# Patient Record
Sex: Male | Born: 1965 | Race: White | Hispanic: No | Marital: Single | Smoking: Current every day smoker
Health system: Southern US, Community
[De-identification: ages and names within clinical notes are randomized; demographics above are authoritative.]

## PROBLEM LIST (undated history)

## (undated) DIAGNOSIS — E119 Type 2 diabetes mellitus without complications: Secondary | ICD-10-CM

## (undated) DIAGNOSIS — N401 Enlarged prostate with lower urinary tract symptoms: Secondary | ICD-10-CM

## (undated) DIAGNOSIS — N4830 Priapism, unspecified: Secondary | ICD-10-CM

## (undated) DIAGNOSIS — N529 Male erectile dysfunction, unspecified: Secondary | ICD-10-CM

## (undated) DIAGNOSIS — Z794 Long term (current) use of insulin: Secondary | ICD-10-CM

## (undated) DIAGNOSIS — N138 Other obstructive and reflux uropathy: Secondary | ICD-10-CM

---

## 2007-06-23 ENCOUNTER — Other Ambulatory Visit: Payer: Self-pay

## 2007-06-23 ENCOUNTER — Inpatient Hospital Stay: Payer: Self-pay | Admitting: Internal Medicine

## 2007-06-24 ENCOUNTER — Other Ambulatory Visit: Payer: Self-pay

## 2015-07-26 ENCOUNTER — Emergency Department: Payer: Self-pay

## 2015-07-26 ENCOUNTER — Emergency Department
Admission: EM | Admit: 2015-07-26 | Discharge: 2015-07-27 | Disposition: A | Discharge status: Home / Self Care | Payer: Self-pay | Attending: Emergency Medicine | Admitting: Emergency Medicine

## 2015-07-26 DIAGNOSIS — T84498A Other mechanical complication of other internal orthopedic devices, implants and grafts, initial encounter: Secondary | ICD-10-CM

## 2015-07-26 DIAGNOSIS — T849XXA Unspecified complication of internal orthopedic prosthetic device, implant and graft, initial encounter: Secondary | ICD-10-CM | POA: Insufficient documentation

## 2015-07-26 DIAGNOSIS — S99921A Unspecified injury of right foot, initial encounter: Secondary | ICD-10-CM | POA: Insufficient documentation

## 2015-07-26 DIAGNOSIS — W1789XA Other fall from one level to another, initial encounter: Secondary | ICD-10-CM | POA: Insufficient documentation

## 2015-07-26 DIAGNOSIS — S99922A Unspecified injury of left foot, initial encounter: Secondary | ICD-10-CM | POA: Insufficient documentation

## 2015-07-26 MED ORDER — HYDROCODONE-ACETAMINOPHEN 5-325 MG PO TABS
1.0000 | ORAL_TABLET | Freq: Four times a day (QID) | ORAL | Status: DC | PRN
Start: 2015-07-26 — End: 2015-08-01

## 2015-07-26 MED ORDER — HYDROCODONE-ACETAMINOPHEN 5-325 MG PO TABS
2.0000 | ORAL_TABLET | Freq: Once | ORAL | Status: AC
Start: 2015-07-26 — End: 2015-07-26
  Administered 2015-07-26: 2 via ORAL

## 2015-07-26 MED ORDER — HYDROCODONE-ACETAMINOPHEN 5-325 MG PO TABS
ORAL_TABLET | Freq: Once | ORAL | Status: AC
Start: 2015-07-27 — End: 2015-07-27

## 2015-07-26 MED ORDER — HYDROCODONE-ACETAMINOPHEN 5-325 MG PO TABS
ORAL_TABLET | ORAL | Status: AC
Start: 2015-07-26 — End: ?
  Filled 2015-07-26: qty 2

## 2015-07-27 MED ORDER — HYDROCODONE-ACETAMINOPHEN 5-325 MG PO TABS
ORAL_TABLET | ORAL | Status: AC
Start: 2015-07-27 — End: ?
  Filled 2015-07-27: qty 2

## 2015-07-27 NOTE — Discharge Instructions (Signed)
Your xray showed a broken calcaneal screw in your right foot

## 2015-07-27 NOTE — ED Provider Notes (Signed)
Physician/Midlevel provider first contact with patient: 07/26/15 2305         [facility] EMERGENCY DEPARTMENT History and Physical Exam      Patient Name: [name], [name]  Encounter Date:  07/26/2015  Attending Physician: Justice Britain, MD  PCP: Christa See, MD  Patient DOB:  [birth year]  MRN:  [id number]  Room:  E13/EDA13-A      History of Presenting Illness     Chief complaint: Foot Pain    HPI/ROS is limited by: none  HPI/ROS given by: patient    Location: heels b/l feet  Duration: since this evening  Severity: severe    Aidric Bowman is a 49 y.o. male who presents with b/l foot pain.  He states he jumped down 2.5 feet off of his truck and has had severe pain, sharp, worse with attempted weight baring.  He notes prior injury from fall from height requiring hardware placement in b/l feet.  R foot hurts more then left.  He denies other pain/injury      Review of Systems     Review of Systems   Endo/Heme/Allergies: Does not bruise/bleed easily.        Allergies     Pt has No Known Allergies.    Medications     Current Outpatient Rx   Name  Route  Sig  Dispense  Refill   . HYDROcodone-acetaminophen (NORCO) 5-325 MG per tablet    Oral    Take 1-2 tablets by mouth every 6 (six) hours as needed.    20 tablet    0     . naproxen sodium (ANAPROX) 220 MG tablet    Oral    Take 220 mg by mouth 2 (two) times daily as needed.                  Past Medical History     Pt has no past medical history on file.    Past Surgical History     Pt has past surgical history that includes Knee surgery (Right) and Foot surgery (Bilateral).    Family History     The family history is not on file.    Social History     Pt reports that he has been smoking.  He does not have any smokeless tobacco history on file. He reports that he does not drink alcohol or use illicit drugs.    Physical Exam     Blood pressure 140/83, pulse 94, temperature 97.5 F (36.4 C), temperature source Oral, resp. rate 19, height 1.854 m, weight 122.471 kg, SpO2 96  %.    Constitutional: Vital signs reviewed. Well appearing.  Head: Normocephalic, atraumatic  Eyes: Conjunctiva and sclera are normal.  No injection or discharge.  Ears, Nose, Throat:  Normal external examination of the nose and ears.    Neck: Normal range of motion. Trachea midline.  Respiratory/Chest:  No respiratory distress.   Upper Extremity:  No edema. No cyanosis.  Lower Extremity:  No edema. No cyanosis.  Surgical scars.  TTP plantar aspect b/l heels.  No ankle or dorsal TTP  Skin: Warm and dry. No rash.  Psychiatric:  Normal affect.  Normal insight  Neuro: strength and sensation in b/l LE intact.     Orders Placed     Orders Placed This Encounter   Procedures   . XR Foot Left AP Lateral And Oblique   . XR Foot Right AP Lateral And Oblique       Diagnostic Results  The results of the diagnostic studies below have been reviewed by myself:    Labs  Results     ** No results found for the last 24 hours. **          Radiologic Studies  Radiology Results (24 Hour)     Procedure Component Value Units Date/Time    XR Foot Left AP Lateral And Oblique [[id number]] Collected:  07/26/15 2343    Order Status:  Completed Updated:  07/26/15 2345    Narrative:      Clinical History:  Fall/jumping injury with foot pain.    Examination:  AP, lateral and oblique views of the left foot.    Comparison:  None available.      Impression:        Calcaneal fixation by plate and screw hardware.    Diffuse osteopenia. No definite acute fracture identified. No dislocation.    Plantar calcaneal enthesophyte noted.  ReadingStation:[email or web address]    XR Foot Right AP Lateral And Oblique [[id number]] Collected:  07/26/15 2340    Order Status:  Completed Updated:  07/26/15 2343    Narrative:      Clinical History:  Foot pain after fall/jumping    Examination:  AP, lateral and oblique views of the right foot.    Comparison:  None.      Impression:        Sequela of calcaneal fixation by plate and screw hardware.     There is a fractured  calcaneal screw best seen on AP and oblique views which is age-indeterminate.    Diffuse osteopenia. No definite acute fractures identified. No dislocation.      ReadingStation:[email or web address]          EKG: none      MDM / Critical Care     Blood pressure 140/83, pulse 94, temperature 97.5 F (36.4 C), temperature source Oral, resp. rate 19, height 1.854 m, weight 122.471 kg, SpO2 96 %.    DDX includes fracture, plantar fasciitis, hardware dislodgement among others.    ED Course     Xray notable for broken screw.  Pt states he will f/u with his surgeon upon arriving home after his trip.  Analgesia provided.    Procedures         Diagnosis / Disposition     Clinical Impression  1. Internal fixation device (pin, rod, or screw) mechanical complication, initial encounter    2. Foot injury, right, initial encounter    3. Foot injury, left, initial encounter        Disposition  ED Disposition     Discharge Thoren Dukes discharge to home/self care.    Condition at disposition: Stable            Prescriptions  Discharge Medication List as of 07/26/2015 11:58 PM      START taking these medications    Details   HYDROcodone-acetaminophen (NORCO) 5-325 MG per tablet Take 1-2 tablets by mouth every 6 (six) hours as needed., Starting 07/26/2015, Until Discontinued, Print                         Justice Britain, MD  07/27/15 [phone]

## 2015-07-31 ENCOUNTER — Emergency Department: Payer: Self-pay

## 2015-07-31 ENCOUNTER — Emergency Department
Admission: EM | Admit: 2015-07-31 | Discharge: 2015-08-01 | Disposition: A | Discharge status: Home / Self Care | Payer: Self-pay | Attending: Sports Medicine" | Admitting: Sports Medicine"

## 2015-07-31 DIAGNOSIS — M79671 Pain in right foot: Secondary | ICD-10-CM | POA: Insufficient documentation

## 2015-07-31 NOTE — ED Provider Notes (Signed)
[facility] EMERGENCY DEPARTMENT History and Physical Exam      Patient Name: [name], [name]  Encounter Date:  07/31/2015  Attending Physician: Gareth Morgan, MD  PCP: Christa See, MD  Patient DOB:  09-Mar-1966  MRN:  [id number]  Room:  E14/EDA14-A      History of Presenting Illness     Chief complaint: Foot Pain    HPI/ROS is limited by: none  HPI/ROS given by: patient    CONTEXT: Sahand Corriher is a 49 y.o. male who presents with Complaints of right heel pain. The patient was seen here 6 days ago for the same complaint. He has a history of bilateral calcanei of fractures in the past treated with Plates and screws. He states that last week he jumped and landed on his feet. He was seen in this Emergency Department at that time with bilateral (right > left) heel pain. He had x-rays of both heels. The right heel showed a fractured screw but no bony fractures of either foot noted. At this time he is not having any pain in his left heel.  He states that he contacted with his orthopedist back in [place] where he is from open  (He is working in this area until next Saturday)  He states that his orthopedist com stated he might need an antibiotic and case he is developing an infection. He has had no drainage from the foot.  LOCATION: Pain is over the posterior aspect of his right heel.  SEVERITY: The symptoms are described as Moderate to severe. He ranks the pain a 9 on a scale of 0-10.   DURATION: Pain has been going on for about a week ever since he jumped and landed on his heels.   QUALITY: Pain as sharp throbbing and constant.   ASSOCIATED SIGNS/ SYMPTOMS: He has had no fever chills nausea or vomiting.  EXACERBATING/ MITIGATING FACTORS: Pain is worse with walking or anything that to just the plantar aspect of his right foot.     Review of Systems     Review of Systems   Constitutional: Negative for fever and chills.   Gastrointestinal: Negative for nausea and vomiting.   Skin: Negative for rash.   Neurological: Negative  for tingling.         Allergies     Pt has No Known Allergies.    Medications     Current Outpatient Rx   Name  Route  Sig  Dispense  Refill   . DISCONTD: HYDROcodone-acetaminophen (NORCO) 5-325 MG per tablet    Oral    Take 1-2 tablets by mouth every 6 (six) hours as needed.    20 tablet    0     . DISCONTD: naproxen sodium (ANAPROX) 220 MG tablet    Oral    Take 220 mg by mouth 2 (two) times daily as needed.             . cephALEXin (KEFLEX) 500 MG capsule    Oral    Take 1 capsule (500 mg total) by mouth 3 (three) times daily.    30 capsule    0     . HYDROcodone-acetaminophen (NORCO) 5-325 MG per tablet    Oral    Take 1-2 tablets by mouth every 6 (six) hours as needed. No driving or working while taking.  Avoid alcohol while taking    20 tablet    0     . ibuprofen (ADVIL,MOTRIN) 800 MG tablet    Oral  Take 1 tablet (800 mg total) by mouth every 8 (eight) hours as needed.    20 tablet    0          Past Medical History     Pt has no past medical history on file.    Past Surgical History     Pt has past surgical history that includes Knee surgery (Right) and Foot surgery (Bilateral).    Family History     The family history is not on file.    Social History     Pt reports that he has been smoking.  He does not have any smokeless tobacco history on file. He reports that he does not drink alcohol or use illicit drugs.    Physical Exam     Blood pressure 160/88, pulse 100, temperature 97.8 F (36.6 C), temperature source Oral, resp. rate 22, height 1.854 m, weight 122.471 kg, SpO2 95 %.    GENERAL: The patient is well-developed, well-nourished,   male whom appears in moderate discomfort and in no distress.   Patient is alert, and oriented to person place and circumstance. There is no evidence of respiratory distress. The patient is able to ambulate with weight bearing both feet.    SKIN:  Warm, dry, mucous membranes moist, normal turgor, no rash noted.  EXTREMITIES:  Right heel: There is some tenderness over the  heel area. He has a thick callus to the plantar aspect of his foot. No open sores or redness or drainage noted. Old surgical scars noted to be well healed. Left ear appears grossly the same as the right. There is no tenderness however to the left heel. Extremities otherwise revealed:  No gross visible deformity , free range of motion.  No edema or cyanosis.      Orders Placed     Orders Placed This Encounter   Procedures   . CBC and differential       Diagnostic Results       The results of the diagnostic studies below have been reviewed by myself:    Labs  Results     Procedure Component Value Units Date/Time    CBC and differential [[id number]]  (Abnormal) Collected:  08/01/15 0010    Specimen Information:  Blood from Blood Updated:  08/01/15 0031     WBC 12.8 (H) K/cmm      RBC 4.78 M/cmm      Hemoglobin 14.7 gm/dL      Hematocrit 56.2 %      MCV 91 fL      MCH 31 pg      MCHC 34 gm/dL      RDW 13.0 %      PLT CT 269 K/cmm      MPV 7.4 fL      NEUTROPHIL % 64.7 %      Lymphocytes 24.1 %      Monocytes 7.3 %      Eosinophils % 2.7 %      Basophils % 1.2 %      Neutrophils Absolute 8.3 K/cmm      Lymphocytes Absolute 3.1 K/cmm      Monocytes Absolute 0.9 K/cmm      Eosinophils Absolute 0.3 K/cmm      BASO Absolute 0.2 K/cmm           MDM / Critical Care     Differential diagnosis considered includes: Contusion right heel, but Occult visible fracture right heel, Osteomyelitis considered felt low likelihood,  Procedures     Old records reviewed. The patient was seen here for Bilateral foot pain 5 days ago. He was prescribed hydrocodone/acetaminophen at that time.     Course in ED     I discussed with the patient that we would monitor his orthopedist request in [place] to start him on antibiotics but felt the likelihood of osteomyelitis to be low. We will also arrange a followup with local orthopedist this week For further evaluation.     Diagnosis / Disposition     Clinical Impression  1. Heel pain, right         Disposition  ED Disposition     None          Dion Body, MD  [address]  [place] [address]  [phone]    In 3 days  Return to the Emergency Department if symptoms worsen or if you have any questions whatsoever. Feel free to call at anytime with questions about your diagnosis and/or discharge instructions etc.       Prescriptions  New Prescriptions    CEPHALEXIN (KEFLEX) 500 MG CAPSULE    Take 1 capsule (500 mg total) by mouth 3 (three) times daily.    HYDROCODONE-ACETAMINOPHEN (NORCO) 5-325 MG PER TABLET    Take 1-2 tablets by mouth every 6 (six) hours as needed. No driving or working while taking.  Avoid alcohol while taking    IBUPROFEN (ADVIL,MOTRIN) 800 MG TABLET    Take 1 tablet (800 mg total) by mouth every 8 (eight) hours as needed.       Note:  This chart was generated by the Epic EMR system/ speech recognition and may contain inherent errors, including typographical, or omissions not intended by the user      Gareth Morgan, MD  08/01/15 [phone]

## 2015-08-01 LAB — CBC AND DIFFERENTIAL
Basophils %: 1.2 % (ref 0.0–3.0)
Basophils Absolute: 0.2 10*3/uL (ref 0.0–0.3)
Eosinophils %: 2.7 % (ref 0.0–7.0)
Eosinophils Absolute: 0.3 10*3/uL (ref 0.0–0.8)
Hematocrit: 43.5 % (ref 39.0–52.5)
Hemoglobin: 14.7 gm/dL (ref 13.0–17.5)
Lymphocytes Absolute: 3.1 10*3/uL (ref 0.6–5.1)
Lymphocytes: 24.1 % (ref 15.0–46.0)
MCH: 31 pg (ref 28–35)
MCHC: 34 gm/dL (ref 31–36)
MCV: 91 fL (ref 80–100)
MPV: 7.4 fL (ref 6.0–10.0)
Monocytes Absolute: 0.9 10*3/uL (ref 0.1–1.7)
Monocytes: 7.3 % (ref 3.0–15.0)
Neutrophils %: 64.7 % (ref 42.0–78.0)
Neutrophils Absolute: 8.3 10*3/uL (ref 1.7–8.6)
PLT CT: 269 10*3/uL (ref 130–440)
RBC: 4.78 10*6/uL (ref 4.00–5.70)
RDW: 12.4 % (ref 10.5–14.5)
WBC: 12.8 10*3/uL — ABNORMAL HIGH (ref 4.00–11.00)

## 2015-08-01 MED ORDER — IBUPROFEN 600 MG PO TABS
ORAL_TABLET | ORAL | Status: AC
Start: 2015-08-01 — End: ?
  Filled 2015-08-01: qty 1

## 2015-08-01 MED ORDER — IBUPROFEN 800 MG PO TABS
800.0000 mg | ORAL_TABLET | Freq: Three times a day (TID) | ORAL | Status: AC | PRN
Start: 2015-08-01 — End: ?

## 2015-08-01 MED ORDER — IBUPROFEN 600 MG PO TABS
600.0000 mg | ORAL_TABLET | Freq: Four times a day (QID) | ORAL | Status: DC | PRN
Start: 2015-08-01 — End: 2015-08-01
  Administered 2015-08-01: 600 mg via ORAL

## 2015-08-01 MED ORDER — HYDROCODONE-ACETAMINOPHEN 5-325 MG PO TABS
ORAL_TABLET | ORAL | Status: AC
Start: 2015-08-01 — End: ?
  Filled 2015-08-01: qty 2

## 2015-08-01 MED ORDER — CEPHALEXIN 250 MG PO CAPS
500.0000 mg | ORAL_CAPSULE | Freq: Once | ORAL | Status: AC
Start: 2015-08-01 — End: 2015-08-01
  Administered 2015-08-01: 500 mg via ORAL

## 2015-08-01 MED ORDER — HYDROCODONE-ACETAMINOPHEN 5-325 MG PO TABS
2.0000 | ORAL_TABLET | Freq: Four times a day (QID) | ORAL | Status: DC | PRN
Start: 2015-08-01 — End: 2015-08-01
  Administered 2015-08-01: 2 via ORAL

## 2015-08-01 MED ORDER — CEPHALEXIN 500 MG PO CAPS
500.0000 mg | ORAL_CAPSULE | Freq: Three times a day (TID) | ORAL | Status: AC
Start: 2015-08-01 — End: 2015-08-11

## 2015-08-01 MED ORDER — CEPHALEXIN 250 MG PO CAPS
ORAL_CAPSULE | ORAL | Status: AC
Start: 2015-08-01 — End: ?
  Filled 2015-08-01: qty 2

## 2015-08-01 MED ORDER — HYDROCODONE-ACETAMINOPHEN 5-325 MG PO TABS
1.0000 | ORAL_TABLET | Freq: Four times a day (QID) | ORAL | Status: DC | PRN
Start: 2015-08-01 — End: 2018-03-20

## 2015-08-01 NOTE — ED Notes (Signed)
[facility] Radiology and spoke with Duwayne Heck requesting a copy of patients Xrays from previous visit be mailed to pt house. She is unsure of when it will be able to be shipped out but will make a disc for pt to be mailed.

## 2015-08-01 NOTE — Discharge Instructions (Signed)
Understanding the Pain Response  Your pain is important. It can slow healing and keep you from being active. You may have acute or chronic pain. Both types of pain respond to treatment. Work with your health care professional. Together you can find relief.  Types of pain  Acute pain is caused by a health problem or injury. The pain usually goes away when its cause is treated. You may have pain:   From an illness or injury that needs emergency care.   After an operation, such as heart surgery.   During and after the birth of your baby.  Chronic pain lasts 3 to 6months or more. It can be caused by a health problem or injury, such as arthritis or a shoulder strain. Chronic pain can also exist without a clear cause.  Your perception of pain  Pain is a complex phenomenon that involves many of the chemicals found naturally in the spinal cord and brain. All pain signals travel to the brain. The brain sends back signals to protect the body. The brain also makesits own painkillers (endorphins). These can help reduce the pain.   1. Pain starts in one or more parts of the body. In some cases, the site of the pain is far from its source.  2. Pain signals move through nerves and up the spinal cord.  3. The brain reads the signals as pain. Natural painkillers are released.  4. The feeling of pain can bereduced in this way.     2000-2015 [employer] [address], [place] [address]. All rights reserved. This information is not intended as a substitute for professional medical care. Always follow your healthcare professional's instructions.

## 2015-08-01 NOTE — ED Notes (Signed)
SEE QUICK TRIAGE

## 2015-08-01 NOTE — ED Notes (Signed)
Pt discharged, prescriptions X3 in his hand at discharge. Side effects and narcotic teaching given, included not driving or working machinery while taking narcotic. Ambulatory and co worker is driving him home.

## 2015-08-08 ENCOUNTER — Emergency Department (HOSPITAL_COMMUNITY)
Admission: EM | Admit: 2015-08-08 | Discharge: 2015-08-08 | Disposition: A | Discharge status: Home / Self Care | Payer: Self-pay | Attending: Emergency Medicine | Admitting: Emergency Medicine

## 2015-08-08 ENCOUNTER — Encounter (HOSPITAL_COMMUNITY): Payer: Self-pay | Admitting: *Deleted

## 2015-08-08 ENCOUNTER — Emergency Department (HOSPITAL_COMMUNITY): Payer: Self-pay

## 2015-08-08 DIAGNOSIS — R062 Wheezing: Secondary | ICD-10-CM | POA: Insufficient documentation

## 2015-08-08 DIAGNOSIS — R079 Chest pain, unspecified: Secondary | ICD-10-CM | POA: Insufficient documentation

## 2015-08-08 DIAGNOSIS — Z720 Tobacco use: Secondary | ICD-10-CM | POA: Insufficient documentation

## 2015-08-08 DIAGNOSIS — R05 Cough: Secondary | ICD-10-CM | POA: Insufficient documentation

## 2015-08-08 DIAGNOSIS — R0602 Shortness of breath: Secondary | ICD-10-CM | POA: Insufficient documentation

## 2015-08-08 LAB — CBC
HCT: 44.7 % (ref 39.0–52.0)
HEMOGLOBIN: 15.1 g/dL (ref 13.0–17.0)
MCH: 30.3 pg (ref 26.0–34.0)
MCHC: 33.8 g/dL (ref 30.0–36.0)
MCV: 89.6 fL (ref 78.0–100.0)
Platelets: 275 10*3/uL (ref 150–400)
RBC: 4.99 MIL/uL (ref 4.22–5.81)
RDW: 13.3 % (ref 11.5–15.5)
WBC: 12.4 10*3/uL — ABNORMAL HIGH (ref 4.0–10.5)

## 2015-08-08 LAB — URINALYSIS, ROUTINE W REFLEX MICROSCOPIC
BILIRUBIN URINE: NEGATIVE
Glucose, UA: NEGATIVE mg/dL
Hgb urine dipstick: NEGATIVE
Ketones, ur: NEGATIVE mg/dL
LEUKOCYTES UA: NEGATIVE
NITRITE: NEGATIVE
Protein, ur: NEGATIVE mg/dL
SPECIFIC GRAVITY, URINE: 1.031 — AB (ref 1.005–1.030)
UROBILINOGEN UA: 1 mg/dL (ref 0.0–1.0)
pH: 5.5 (ref 5.0–8.0)

## 2015-08-08 LAB — BASIC METABOLIC PANEL
ANION GAP: 9 (ref 5–15)
BUN: 21 mg/dL — ABNORMAL HIGH (ref 6–20)
CHLORIDE: 104 mmol/L (ref 101–111)
CO2: 26 mmol/L (ref 22–32)
CREATININE: 1.11 mg/dL (ref 0.61–1.24)
Calcium: 9.4 mg/dL (ref 8.9–10.3)
GFR calc non Af Amer: >60 mL/min (ref >60)
Glucose, Bld: 96 mg/dL (ref 65–99)
POTASSIUM: 3.9 mmol/L (ref 3.5–5.1)
Sodium: 139 mmol/L (ref 135–145)

## 2015-08-08 LAB — LIPASE, BLOOD: LIPASE: 17 U/L — AB (ref 22–51)

## 2015-08-08 LAB — I-STAT TROPONIN, ED: TROPONIN I, POC: 0 ng/mL (ref 0.00–0.08)

## 2015-08-08 LAB — TROPONIN I

## 2015-08-08 MED ORDER — ALBUTEROL SULFATE HFA 108 (90 BASE) MCG/ACT IN AERS
2.0000 | INHALATION_SPRAY | Freq: Once | RESPIRATORY_TRACT | Status: AC
  Administered 2015-08-08: 2 via RESPIRATORY_TRACT
  Filled 2015-08-08: qty 6.7

## 2015-08-08 MED ORDER — IOHEXOL 350 MG/ML SOLN
100.0000 mL | Freq: Once | INTRAVENOUS | Status: AC | PRN
  Administered 2015-08-08: 73 mL via INTRAVENOUS

## 2015-08-08 MED ORDER — MORPHINE SULFATE (PF) 4 MG/ML IV SOLN
8.0000 mg | Freq: Once | INTRAVENOUS | Status: AC
  Administered 2015-08-08: 8 mg via INTRAVENOUS
  Filled 2015-08-08: qty 2

## 2015-08-08 MED ORDER — ONDANSETRON HCL 4 MG/2ML IJ SOLN
4.0000 mg | Freq: Once | INTRAMUSCULAR | Status: AC
  Administered 2015-08-08: 4 mg via INTRAVENOUS
  Filled 2015-08-08: qty 2

## 2015-08-08 MED ORDER — RANITIDINE HCL 150 MG/10ML PO SYRP
150.0000 mg | ORAL_SOLUTION | Freq: Once | ORAL | Status: AC
  Administered 2015-08-08: 150 mg via ORAL
  Filled 2015-08-08: qty 10

## 2015-08-08 MED ORDER — GI COCKTAIL ~~LOC~~
30.0000 mL | Freq: Once | ORAL | Status: AC
  Administered 2015-08-08: 30 mL via ORAL
  Filled 2015-08-08: qty 30

## 2015-08-08 NOTE — ED Notes (Signed)
Patient transported to CT 

## 2015-08-08 NOTE — Progress Notes (Signed)
Patient listed as not having insurance or a pcp.  EDCM spoke to patient at bedside.  Patient reports he has traveled from [place] to [facility] on business.  Patient confirms he has a pcp in [place].  EDCM strongly encouraged patient to follow up with his pcp in [place].  No further EDCM needs at this time.

## 2015-08-08 NOTE — ED Provider Notes (Signed)
CSN: [id number]     Arrival date & time 08/08/15  1439 History   First MD Initiated Contact with Patient 08/08/15 1701     Chief Complaint  Patient presents with  . Chest Pain     (Consider location/radiation/quality/duration/timing/severity/associated sxs/prior Treatment) Patient is a 49 y.o. male presenting with chest pain.  Chest Pain Pain location:  L chest Pain quality: aching and sharp   Pain radiates to:  Does not radiate Pain radiates to the back: no   Pain severity:  Mild Onset quality:  Gradual Duration:  3 days Timing:  Constant Progression:  Worsening Chronicity:  New Context: breathing and movement   Associated symptoms: cough and shortness of breath   Associated symptoms: no back pain, no fever, no nausea and not vomiting     History reviewed. No pertinent past medical history. History reviewed. No pertinent past surgical history. History reviewed. No pertinent family history. Social History  Substance Use Topics  . Smoking status: Current Every Day Smoker -- 1.00 packs/day for 20 years    Types: Cigarettes  . Smokeless tobacco: None  . Alcohol Use: No    Review of Systems  Constitutional: Negative for fever and chills.  HENT: Negative for congestion, drooling and ear pain.   Respiratory: Positive for cough and shortness of breath.   Cardiovascular: Positive for chest pain.  Gastrointestinal: Negative for nausea and vomiting.  Endocrine: Negative for polydipsia and polyuria.  Musculoskeletal: Negative for myalgias and back pain.  Skin: Negative for pallor and wound.  All other systems reviewed and are negative.     Allergies  Review of patient's allergies indicates no known allergies.  Home Medications   Prior to Admission medications   Medication Sig Start Date End Date Taking? Authorizing Provider  naproxen sodium (ALEVE) 220 MG tablet Take 440-660 mg by mouth every 12 (twelve) hours as needed (pain).   Yes Historical Provider, MD   BP  128/73 mmHg  Pulse 75  Temp(Src) 98.3 F (36.8 C) (Oral)  Resp 18  SpO2 94% Physical Exam  Constitutional: He is oriented to person, place, and time. He appears well-developed and well-nourished.  HENT:  Head: Normocephalic and atraumatic.  Eyes: Conjunctivae and EOM are normal.  Neck: Normal range of motion. Neck supple.  Cardiovascular: Normal rate and regular rhythm.   Pulmonary/Chest: Effort normal. No respiratory distress. He has wheezes. He exhibits no tenderness.  Abdominal: Soft. Bowel sounds are normal. He exhibits no distension. There is no tenderness.  Musculoskeletal: Normal range of motion. He exhibits no edema or tenderness.  Neurological: He is alert and oriented to person, place, and time.  Skin: Skin is warm and dry.  Nursing note and vitals reviewed.   ED Course  Procedures (including critical care time) Labs Review Labs Reviewed  BASIC METABOLIC PANEL - Abnormal; Notable for the following:    BUN 21 (*)    All other components within normal limits  CBC - Abnormal; Notable for the following:    WBC 12.4 (*)    All other components within normal limits  URINALYSIS, ROUTINE W REFLEX MICROSCOPIC (NOT AT [facility]) - Abnormal; Notable for the following:    Specific Gravity, Urine 1.031 (*)    All other components within normal limits  LIPASE, BLOOD - Abnormal; Notable for the following:    Lipase 17 (*)    All other components within normal limits  TROPONIN I  Rosezena Sensor, ED    Imaging Review Dg Chest 2 View  08/08/2015  CLINICAL DATA:  Shortness of breath and left chest pain for 3 days.  EXAM: CHEST  2 VIEW  COMPARISON:  None.  FINDINGS: The cardiomediastinal silhouette is unremarkable.  Mild peribronchial thickening noted.  There is no evidence of focal airspace disease, pulmonary edema, suspicious pulmonary nodule/mass, pleural effusion, or pneumothorax. No acute bony abnormalities are identified.  IMPRESSION: No evidence of acute cardiopulmonary disease.   Question COPD.   Electronically Signed   By: Harmon Pier M.D.   On: 08/08/2015 15:50   Ct Angio Chest Pe W/cm &/or Wo Cm  08/08/2015   CLINICAL DATA:  49 year old male with chest pain for several days.  EXAM: CT ANGIOGRAPHY CHEST WITH CONTRAST  TECHNIQUE: Multidetector CT imaging of the chest was performed using the standard protocol during bolus administration of intravenous contrast. Multiplanar CT image reconstructions and MIPs were obtained to evaluate the vascular anatomy.  CONTRAST:  73mL OMNIPAQUE IOHEXOL 350 MG/ML SOLN  COMPARISON:  Chest radiograph dated 08/08/2015  FINDINGS: Minimal bibasilar subpleural dependent atelectatic changes. The lungs are clear. There is no pleural effusion or pneumothorax. The central airways are patent.  The visualized thoracic aorta is unremarkable. The no CT evidence of pulmonary embolism. Top-normal right hilar lymph nodes. There is no mediastinal adenopathy. There is coronary vascular calcification. No cardiomegaly or pericardial effusion. Dx the thyroid gland and esophagus appear unremarkable.  There is no axillary adenopathy. The chest wall soft tissues appear unremarkable. The osseous structures are intact. There is diffuse fatty infiltration of the liver. The visualized upper abdomen is otherwise unremarkable.  Review of the MIP images confirms the above findings.  IMPRESSION: No CT evidence of pulmonary embolism.   Electronically Signed   By: Elgie Collard M.D.   On: 08/08/2015 19:35   I have personally reviewed and evaluated these images and lab results as part of my medical decision-making.   EKG Interpretation   Date/Time:  Monday August 08 2015 14:49:00 EDT Ventricular Rate:  102 PR Interval:  132 QRS Duration: 90 QT Interval:  342 QTC Calculation: 445 R Axis:   53 Text Interpretation:  Sinus tachycardia Abnormal R-wave progression, early  transition Since last tracing rate faster Confirmed by WENTZ  MD, ELLIOTT  ([id number]) on 08/08/2015 3:46:11  PM      MDM   Final diagnoses:  SOB (shortness of breath)   49 year old male here with sharp substernal chest pain. Had it days ago but this morning it worsened. Associated symptoms. Initial concerns for pneumonia versus PE. Patient is traveling person so CT scan was done which was negative for both. Improved prior to discharge. EKG was negative. Delta troponin was negative making ACS unlikely, he will continue to follow-up with his doctor for a stress test at home.  I have personally and contemperaneously reviewed labs and imaging and used in my decision making as above.   A medical screening exam was performed and I feel the patient has had an appropriate workup for their chief complaint at this time and likelihood of emergent condition existing is low. They have been counseled on decision, discharge, follow up and which symptoms necessitate immediate return to the emergency department. They or their family verbally stated understanding and agreement with plan and discharged in stable condition.      Marily Memos, MD 08/08/15 [phone]

## 2015-08-08 NOTE — ED Notes (Signed)
Pt concerned that results were inconclusive. Pt is a&ox4 and ambulatory. Questions r/t dc were denied

## 2015-08-08 NOTE — ED Notes (Addendum)
Pt reports chest pain for several days, chest pain increased today. Pain 10/10. Repots SOB, pain increases with deep breath. Intermittent abd pain throughout the day. Denies cardiac hx.

## 2020-05-25 ENCOUNTER — Inpatient Hospital Stay
Admit: 2020-05-25 | Discharge: 2020-05-25 | Disposition: A | Discharge status: Home / Self Care | Attending: Emergency Medicine

## 2020-05-25 DIAGNOSIS — S92411A Displaced fracture of proximal phalanx of right great toe, initial encounter for closed fracture: Secondary | ICD-10-CM

## 2020-05-25 MED ORDER — LIDOCAINE HCL (PF) 1 % IJ SOLN
1 % | Freq: Once | INTRAMUSCULAR | Status: DC
Start: 2020-05-25 — End: 2020-05-25

## 2020-05-25 MED ORDER — OXYCODONE-ACETAMINOPHEN 5-325 MG PO TABS
5-325 MG | Freq: Once | ORAL | Status: AC
Start: 2020-05-25 — End: 2020-05-25
  Administered 2020-05-25: 15:00:00 1 via ORAL

## 2020-05-25 MED ORDER — OXYCODONE-ACETAMINOPHEN 5-325 MG PO TABS
5-325 MG | ORAL_TABLET | Freq: Four times a day (QID) | ORAL | 0 refills | Status: DC | PRN
Start: 2020-05-25 — End: 2020-05-28

## 2020-05-25 MED FILL — OXYCODONE-ACETAMINOPHEN 5-325 MG PO TABS: 5-325 mg | ORAL | Qty: 1

## 2020-05-28 ENCOUNTER — Inpatient Hospital Stay
Admit: 2020-05-28 | Discharge: 2020-05-28 | Disposition: A | Discharge status: Home / Self Care | Attending: Emergency Medicine

## 2020-05-28 DIAGNOSIS — S92411D Displaced fracture of proximal phalanx of right great toe, subsequent encounter for fracture with routine healing: Secondary | ICD-10-CM

## 2020-05-28 MED ORDER — HYDROMORPHONE HCL 1 MG/ML IJ SOLN
1 MG/ML | INTRAMUSCULAR | Status: AC
Start: 2020-05-28 — End: 2020-05-28
  Administered 2020-05-28: 15:00:00 1 via INTRAMUSCULAR

## 2020-05-28 MED ORDER — CEPHALEXIN 250 MG PO CAPS
250 MG | ORAL_CAPSULE | Freq: Four times a day (QID) | ORAL | 0 refills | Status: AC
Start: 2020-05-28 — End: 2020-06-04

## 2020-05-28 MED ORDER — BACITRACIN 500 UNIT/GM EX OINT
500 UNIT/GM | Freq: Once | CUTANEOUS | Status: AC
Start: 2020-05-28 — End: 2020-05-28
  Administered 2020-05-28: 15:00:00 via TOPICAL

## 2020-05-28 MED ORDER — OXYCODONE-ACETAMINOPHEN 5-325 MG PO TABS
5-325 MG | ORAL_TABLET | Freq: Four times a day (QID) | ORAL | 0 refills | Status: AC | PRN
Start: 2020-05-28 — End: 2020-05-31

## 2020-05-28 MED ORDER — HYDROMORPHONE HCL 1 MG/ML IJ SOLN
1 MG/ML | Freq: Once | INTRAMUSCULAR | Status: AC
Start: 2020-05-28 — End: 2020-05-28

## 2020-05-28 MED FILL — DILAUDID 1 MG/ML IJ SOLN: 1 mg/mL | INTRAMUSCULAR | Qty: 1

## 2020-05-28 MED FILL — BACITRACIN 500 UNIT/GM EX OINT: 500 UNIT/GM | CUTANEOUS | Qty: 14

## 2022-05-09 NOTE — Progress Notes (Signed)
Pre Procedure Patient Instructions    Procedure Location hospital: [facility]: [address] [place] - Drop off at Outpatient Services to the left of the main hospital entrance and proceed to the gold elevator on your left (past the security desk) to the 2nd floor Surgery Reception desk.   Procedure Date 05/17/2022  Arrival Time  0600      Medications:  Medication to be taken the morning of surgery with a few sips of water only: tamsulosin, finesteride  Stop all supplements, vitamins and herbal remedies one week prior to your procedure, unless your doctor told you to continue taking.  Do not take over the counter pain medications except plain Tylenol or Acetaminophen unless your doctor told you to do so.  If you are taking blood thinners, call the doctor performing your procedure and prescribing physician for instructions on when to stop.    Procedure Preparation    Diet Restrictions:No food or drink including gum or mints -after midnight    Skin Preparation:   Wash with Hibiclens or an antibacterial soap (e.g. Dial soap) the night before and morning of procedure.  Do not put on any deodorants, lotions, powders, or oils afterwards.  Be sure to put on clean clothes    Other Preparation:  Call your surgeon right away if you get any wounds, cuts, scrapes, scabs, rashes, bug bites at or near your operative site or if you have any fever, cold or flu symptoms.        Day of Procedure Patient Instruction:  Do not smoke, vape, chew tobacco, drink alcohol or use recreational drugs on the day of your procedure  Remove all jewelry, piercings, and metal accessories  Do not wear contacts, tampons, make-up, lotions, creams, powders, fragrances or deodorant  Do not bring valuables or money  Bring a copy of your Living Will and/or Medical Durable Power of Attorney if you have one  Bring a list of current medications including name and dosage  Bring a picture ID and insurance card and any of the  following that are applicable to you:     Inhalers      CPAP or BiPAP machine     Remote for spinal cord stimulator or other implanted device     Insulin pump supplies     Walker or other orthopedic device necessary for postop     Storage case for eyeglasses, hearing aids, dentures, etc     A loose button-up shirt if instructed          .  If you are going home the same day as your procedure, a support person should accompany you to the facility and must transport you home.  If you plan to take public transportation of any sort, your support person must accompany you home.  You will need someone to stay with you for 24 hours after your procedure with sedation of any kind.             The information and visitor policy was reviewed with you during your Pre-Admission Testing interview and you verbalized understanding. If you have any additional questions please contact [phone]    For financial questions regarding your procedure at a [facility] facility, please contact [phone], option 1    For financial questions regarding anesthesia at a [facility] facility, please  contact [phone]

## 2022-05-16 NOTE — H&P (Signed)
Urology Attending Admission Note      Reason for Admission: increasing obstructive symptoms from enlarging prostate    History: the patient has developed increasing urgency and some urge incontinence cystoscopy shows a large obstructing proximal state with bladder trabeculation he is admitted for transurethral resection of the prostate    Meds: see med rec  Family History, Social History, Review of Systems:  Reviewed and agreed to as per chart    Exam:    Vitals:  Ht 6\' 1"  (1.854 m)   Wt 259 lb (117.5 kg)   BMI 34.17 kg/m   No data recorded  No intake or output data in the 24 hours ending 05/16/22 1648    Physical:   Well developed, well nourished in no acute distress  Mood indicates no abnormalities. Pt doesn't appear depressed  Orientated to time and place  Neck is supple, trachea is midline  Respiratory effort is normal  Cardiovascular show no extremity swelling  Abdomen no masses or hernias are palpated, there is no tenderness. Liver and Spleen appear normal.  Skin show no abnormal lesions  Lymph nodes are not palpated in the inguinal, neck, or axillary area.     Male GU:  Penis appears normal and circumcised  Urethral meatus is normal in size and location  Scrotum appears normal and both testicles appear normal in size and location  Sphincter has good tone  Anus is inspected. There are no perineal masses  Prostate is enlarged without nodule, no tenderness and no induration  Seminal Vesicles are not palpable      Labs:  WBC:  No results found for: WBC  Hemoglobin/Hematocrit:  No results found for: HGB, HCT  BMP:  No results found for: NA, K, CL, CO2, BUN, LABALBU, CREATININE, CALCIUM, GFRAA, LABGLOM  PT/INR:  No results found for: PROTIME, INR  PTT:  No results found for: APTT[APTT    Urinalysis: negative    Imaging:  none    Impression/Plan: BPH with a large prostate and increasing obstructive and urgency symptoms cystoscopy confirms patients admitted for TURP    [facility], MD

## 2022-05-17 ENCOUNTER — Inpatient Hospital Stay
Admission: EM | Admit: 2022-05-17 | Discharge: 2022-05-19 | Disposition: A | Discharge status: Home / Self Care | Payer: BLUE CROSS/BLUE SHIELD | Admitting: Urology

## 2022-05-17 DIAGNOSIS — N401 Enlarged prostate with lower urinary tract symptoms: Secondary | ICD-10-CM

## 2022-05-17 DIAGNOSIS — N138 Other obstructive and reflux uropathy: Secondary | ICD-10-CM

## 2022-05-17 LAB — CBC
Hematocrit: 49.1 % (ref 38.0–52.0)
Hemoglobin: 16.8 g/dL (ref 13.0–17.3)
MCH: 29.8 pg (ref 27.0–34.5)
MCHC: 34.2 g/dL (ref 32.0–36.0)
MCV: 87.2 fL (ref 84.0–100.0)
MPV: 9.9 fL (ref 7.2–13.2)
NRBC Absolute: 0 10*3/uL (ref 0.000–0.012)
NRBC Automated: 0 % (ref 0.0–0.2)
Platelets: 309 10*3/uL (ref 140–440)
RBC: 5.63 x10e6/mcL — ABNORMAL HIGH (ref 4.00–5.60)
RDW: 13.5 % (ref 11.0–16.0)
WBC: 12.3 10*3/uL — ABNORMAL HIGH (ref 3.8–10.6)

## 2022-05-17 LAB — POCT GLUCOSE
POC Glucose: 95 mg/dL (ref 65.0–110.0)
POC Glucose: 92 mg/dL (ref 65.0–110.0)
POC Glucose: 144 mg/dL — ABNORMAL HIGH (ref 65.0–110.0)

## 2022-05-17 MED ORDER — SODIUM CHLORIDE 0.9 % IV SOLN
0.9 % | INTRAVENOUS | Status: AC | PRN
Start: 2022-05-17 — End: 2022-05-19

## 2022-05-17 MED ORDER — HYDROCODONE-ACETAMINOPHEN 5-325 MG PO TABS
5-325 MG | ORAL | Status: AC
Start: 2022-05-17 — End: 2022-05-17
  Administered 2022-05-17: 16:00:00 1 via ORAL

## 2022-05-17 MED ORDER — GLUCAGON HCL (DIAGNOSTIC) 1 MG IJ SOLR
1 MG | INTRAMUSCULAR | Status: AC | PRN
Start: 2022-05-17 — End: 2022-05-19

## 2022-05-17 MED ORDER — NORMAL SALINE FLUSH 0.9 % IV SOLN
0.9 % | INTRAVENOUS | Status: DC | PRN
Start: 2022-05-17 — End: 2022-05-17

## 2022-05-17 MED ORDER — MORPHINE SULFATE 2 MG/ML IJ SOLN
2 MG/ML | INTRAMUSCULAR | Status: AC | PRN
Start: 2022-05-17 — End: 2022-05-19
  Administered 2022-05-17 – 2022-05-19 (×7): 2 mg via INTRAVENOUS

## 2022-05-17 MED ORDER — NORMAL SALINE FLUSH 0.9 % IV SOLN
0.9 % | Freq: Two times a day (BID) | INTRAVENOUS | Status: AC
Start: 2022-05-17 — End: 2022-05-19
  Administered 2022-05-17 – 2022-05-19 (×3): 10 mL via INTRAVENOUS

## 2022-05-17 MED ORDER — HYDROMORPHONE HCL 2 MG/ML IJ SOLN
2 MG/ML | INTRAMUSCULAR | Status: AC
Start: 2022-05-17 — End: ?

## 2022-05-17 MED ORDER — NICOTINE 14 MG/24HR TD PT24
1424 MG/24HR | Freq: Every day | TRANSDERMAL | Status: DC
Start: 2022-05-17 — End: 2022-05-19
  Administered 2022-05-17 – 2022-05-19 (×3): 1 via TRANSDERMAL

## 2022-05-17 MED ORDER — HYDROMORPHONE HCL 1 MG/ML IJ SOLN
1 MG/ML | INTRAMUSCULAR | Status: DC | PRN
Start: 2022-05-17 — End: 2022-05-17

## 2022-05-17 MED ORDER — INSULIN LISPRO 100 UNIT/ML IJ SOLN
100 UNIT/ML | Freq: Three times a day (TID) | INTRAMUSCULAR | Status: AC
Start: 2022-05-17 — End: 2022-05-19

## 2022-05-17 MED ORDER — NORMAL SALINE FLUSH 0.9 % IV SOLN
0.9 % | Freq: Two times a day (BID) | INTRAVENOUS | Status: DC
Start: 2022-05-17 — End: 2022-05-17

## 2022-05-17 MED ORDER — ONDANSETRON 4 MG PO TBDP
4 MG | Freq: Three times a day (TID) | ORAL | Status: AC | PRN
Start: 2022-05-17 — End: 2022-05-19

## 2022-05-17 MED ORDER — PROPOFOL 200 MG/20ML IV EMUL
200 MG/20ML | INTRAVENOUS | Status: AC
Start: 2022-05-17 — End: ?

## 2022-05-17 MED ORDER — HYDROMORPHONE HCL 2 MG PO TABS
2 MG | ORAL | Status: AC | PRN
Start: 2022-05-17 — End: 2022-05-19
  Administered 2022-05-18 – 2022-05-19 (×6): 2 mg via ORAL

## 2022-05-17 MED ORDER — LIDOCAINE HCL (PF) 2 % IJ SOLN
2 % | INTRAMUSCULAR | Status: DC | PRN
Start: 2022-05-17 — End: 2022-05-17
  Administered 2022-05-17: 12:00:00 100 via INTRAVENOUS

## 2022-05-17 MED ORDER — MIDAZOLAM HCL 2 MG/2ML IJ SOLN
2 MG/ML | INTRAMUSCULAR | Status: AC
Start: 2022-05-17 — End: ?

## 2022-05-17 MED ORDER — DEXTROSE 10 % IV BOLUS
INTRAVENOUS | Status: AC | PRN
Start: 2022-05-17 — End: 2022-05-19

## 2022-05-17 MED ORDER — IBUPROFEN 200 MG PO TABS
200 MG | Freq: Three times a day (TID) | ORAL | Status: AC | PRN
Start: 2022-05-17 — End: 2022-05-19

## 2022-05-17 MED ORDER — FENTANYL CITRATE (PF) 100 MCG/2ML IJ SOLN
100 MCG/2ML | INTRAMUSCULAR | Status: DC | PRN
Start: 2022-05-17 — End: 2022-05-17
  Administered 2022-05-17 (×2): 50 via INTRAVENOUS

## 2022-05-17 MED ORDER — DEXMEDETOMIDINE HCL 200 MCG/2ML IV SOLN
200 MCG/2ML | INTRAVENOUS | Status: AC
Start: 2022-05-17 — End: ?

## 2022-05-17 MED ORDER — ONDANSETRON HCL 4 MG/2ML IJ SOLN
4 MG/2ML | INTRAMUSCULAR | Status: AC
Start: 2022-05-17 — End: ?

## 2022-05-17 MED ORDER — MIDAZOLAM HCL 2 MG/2ML IJ SOLN
2 MG/ML | Freq: Once | INTRAMUSCULAR | Status: DC | PRN
Start: 2022-05-17 — End: 2022-05-17

## 2022-05-17 MED ORDER — HYDROMORPHONE HCL 2 MG PO TABS
2 | ORAL | Status: DC | PRN
Start: 2022-05-17 — End: 2022-05-19

## 2022-05-17 MED ORDER — LABETALOL HCL 5 MG/ML IV SOLN
5 MG/ML | INTRAVENOUS | Status: DC | PRN
Start: 2022-05-17 — End: 2022-05-17

## 2022-05-17 MED ORDER — DIPHENHYDRAMINE HCL 50 MG/ML IJ SOLN
50 MG/ML | Freq: Once | INTRAMUSCULAR | Status: DC | PRN
Start: 2022-05-17 — End: 2022-05-17

## 2022-05-17 MED ORDER — SODIUM CHLORIDE 0.9 % IV SOLN
0.9 % | INTRAVENOUS | Status: DC | PRN
Start: 2022-05-17 — End: 2022-05-17

## 2022-05-17 MED ORDER — IPRATROPIUM-ALBUTEROL 0.5-2.5 (3) MG/3ML IN SOLN
Freq: Once | RESPIRATORY_TRACT | Status: DC | PRN
Start: 2022-05-17 — End: 2022-05-17

## 2022-05-17 MED ORDER — PROPOFOL 200 MG/20ML IV EMUL
200 MG/20ML | INTRAVENOUS | Status: DC | PRN
Start: 2022-05-17 — End: 2022-05-17
  Administered 2022-05-17: 12:00:00 200 via INTRAVENOUS

## 2022-05-17 MED ORDER — TAMSULOSIN HCL 0.4 MG PO CAPS
0.4 MG | Freq: Every day | ORAL | Status: AC
Start: 2022-05-17 — End: 2022-05-19
  Administered 2022-05-18 – 2022-05-19 (×2): 0.4 mg via ORAL

## 2022-05-17 MED ORDER — LACTATED RINGERS IV SOLN
INTRAVENOUS | Status: AC
Start: 2022-05-17 — End: 2022-05-19

## 2022-05-17 MED ORDER — HYDROMORPHONE HCL 2 MG/ML IJ SOLN
2 MG/ML | INTRAMUSCULAR | Status: DC | PRN
Start: 2022-05-17 — End: 2022-05-17
  Administered 2022-05-17 (×2): .5 via INTRAVENOUS

## 2022-05-17 MED ORDER — LACTATED RINGERS IV SOLN
INTRAVENOUS | Status: DC
Start: 2022-05-17 — End: 2022-05-17
  Administered 2022-05-17 (×3): via INTRAVENOUS

## 2022-05-17 MED ORDER — FENTANYL CITRATE (PF) 100 MCG/2ML IJ SOLN
100 MCG/2ML | INTRAMUSCULAR | Status: AC
Start: 2022-05-17 — End: ?

## 2022-05-17 MED ORDER — SODIUM CHLORIDE 0.45 % IV SOLN
0.45 % | INTRAVENOUS | Status: AC
Start: 2022-05-17 — End: 2022-05-19
  Administered 2022-05-17 – 2022-05-18 (×2): via INTRAVENOUS

## 2022-05-17 MED ORDER — DEXTROSE 10 % IV SOLN
10 % | INTRAVENOUS | Status: AC | PRN
Start: 2022-05-17 — End: 2022-05-19

## 2022-05-17 MED ORDER — ONDANSETRON HCL 4 MG/2ML IJ SOLN
4 MG/2ML | INTRAMUSCULAR | Status: DC | PRN
Start: 2022-05-17 — End: 2022-05-17
  Administered 2022-05-17: 12:00:00 4 via INTRAVENOUS

## 2022-05-17 MED ORDER — CEFAZOLIN 3000 MG IN NS 100 ML IVPB
Status: AC
Start: 2022-05-17 — End: 2022-05-17
  Administered 2022-05-17: 12:00:00 3000 mg via INTRAVENOUS

## 2022-05-17 MED ORDER — SUCCINYLCHOLINE CHLORIDE 20 MG/ML IJ SOLN
20 MG/ML | INTRAMUSCULAR | Status: DC | PRN
Start: 2022-05-17 — End: 2022-05-17
  Administered 2022-05-17: 12:00:00 180 via INTRAVENOUS

## 2022-05-17 MED ORDER — MIDAZOLAM HCL 2 MG/2ML IJ SOLN
2 MG/ML | INTRAMUSCULAR | Status: DC | PRN
Start: 2022-05-17 — End: 2022-05-17
  Administered 2022-05-17: 12:00:00 2 via INTRAVENOUS

## 2022-05-17 MED ORDER — GLUCOSE 4 G PO CHEW
4 g | ORAL | Status: AC | PRN
Start: 2022-05-17 — End: 2022-05-19

## 2022-05-17 MED ORDER — SUCCINYLCHOLINE CHLORIDE 20 MG/ML IJ SOLN
20 MG/ML | INTRAMUSCULAR | Status: AC
Start: 2022-05-17 — End: ?

## 2022-05-17 MED ORDER — NORMAL SALINE FLUSH 0.9 % IV SOLN
0.9 % | INTRAVENOUS | Status: AC | PRN
Start: 2022-05-17 — End: 2022-05-19

## 2022-05-17 MED ORDER — DEXAMETHASONE SODIUM PHOSPHATE 4 MG/ML IJ SOLN
4 MG/ML | INTRAMUSCULAR | Status: AC
Start: 2022-05-17 — End: ?

## 2022-05-17 MED ORDER — LIDOCAINE HCL (PF) 2 % IJ SOLN
2 % | INTRAMUSCULAR | Status: AC
Start: 2022-05-17 — End: ?

## 2022-05-17 MED ORDER — LIDOCAINE HCL (PF) 1 % IJ SOLN
1 % | Freq: Once | INTRAMUSCULAR | Status: AC | PRN
Start: 2022-05-17 — End: 2022-05-17
  Administered 2022-05-17: 11:00:00 0.2 mL via INTRADERMAL

## 2022-05-17 MED ORDER — ONDANSETRON HCL 4 MG/2ML IJ SOLN
4 MG/2ML | Freq: Four times a day (QID) | INTRAMUSCULAR | Status: AC | PRN
Start: 2022-05-17 — End: 2022-05-19

## 2022-05-17 MED ORDER — INSULIN LISPRO 100 UNIT/ML IJ SOLN
100 UNIT/ML | Freq: Every evening | INTRAMUSCULAR | Status: AC
Start: 2022-05-17 — End: 2022-05-19

## 2022-05-17 MED ORDER — PRAVASTATIN SODIUM 20 MG PO TABS
20 MG | Freq: Every evening | ORAL | Status: AC
Start: 2022-05-17 — End: 2022-05-19
  Administered 2022-05-18 – 2022-05-19 (×2): 10 mg via ORAL

## 2022-05-17 MED ORDER — DEXAMETHASONE SODIUM PHOSPHATE 4 MG/ML IJ SOLN
4 MG/ML | INTRAMUSCULAR | Status: DC | PRN
Start: 2022-05-17 — End: 2022-05-17
  Administered 2022-05-17: 12:00:00 8 via INTRAVENOUS

## 2022-05-17 MED ORDER — ACETAMINOPHEN 325 MG PO TABS
325 MG | ORAL | Status: AC | PRN
Start: 2022-05-17 — End: 2022-05-19

## 2022-05-17 MED ORDER — HYDROCODONE-ACETAMINOPHEN 5-325 MG PO TABS
5-325 MG | Freq: Once | ORAL | Status: AC
Start: 2022-05-17 — End: 2022-05-17

## 2022-05-17 MED ORDER — HALOPERIDOL LACTATE 5 MG/ML IJ SOLN
5 MG/ML | Freq: Once | INTRAMUSCULAR | Status: DC | PRN
Start: 2022-05-17 — End: 2022-05-17

## 2022-05-17 MED ORDER — FINASTERIDE 5 MG PO TABS
5 MG | Freq: Every day | ORAL | Status: AC
Start: 2022-05-17 — End: 2022-05-19
  Administered 2022-05-18 – 2022-05-19 (×2): 5 mg via ORAL

## 2022-05-17 MED ORDER — DEXMEDETOMIDINE HCL 200 MCG/2ML IV SOLN
200 MCG/2ML | INTRAVENOUS | Status: DC | PRN
Start: 2022-05-17 — End: 2022-05-17
  Administered 2022-05-17: 12:00:00 10 via INTRAVENOUS
  Administered 2022-05-17: 13:00:00 20 via INTRAVENOUS
  Administered 2022-05-17: 12:00:00 10 via INTRAVENOUS

## 2022-05-17 MED FILL — XYLOCAINE-MPF 1 % IJ SOLN: 1 % | INTRAMUSCULAR | Qty: 2

## 2022-05-17 MED FILL — DEXMEDETOMIDINE HCL 200 MCG/2ML IV SOLN: 200 MCG/2ML | INTRAVENOUS | Qty: 2

## 2022-05-17 MED FILL — HYDROMORPHONE HCL 2 MG/ML IJ SOLN: 2 MG/ML | INTRAMUSCULAR | Qty: 1

## 2022-05-17 MED FILL — MORPHINE SULFATE 2 MG/ML IJ SOLN: 2 mg/mL | INTRAMUSCULAR | Qty: 1

## 2022-05-17 MED FILL — FENTANYL CITRATE (PF) 100 MCG/2ML IJ SOLN: 100 MCG/2ML | INTRAMUSCULAR | Qty: 2

## 2022-05-17 MED FILL — MIDAZOLAM HCL 2 MG/2ML IJ SOLN: 2 MG/ML | INTRAMUSCULAR | Qty: 2

## 2022-05-17 MED FILL — HYDROCODONE-ACETAMINOPHEN 5-325 MG PO TABS: 5-325 MG | ORAL | Qty: 1

## 2022-05-17 MED FILL — DIPRIVAN 200 MG/20ML IV EMUL: 200 MG/20ML | INTRAVENOUS | Qty: 20

## 2022-05-17 MED FILL — DEXAMETHASONE SODIUM PHOSPHATE 4 MG/ML IJ SOLN: 4 MG/ML | INTRAMUSCULAR | Qty: 2

## 2022-05-17 MED FILL — XYLOCAINE-MPF 2 % IJ SOLN: 2 % | INTRAMUSCULAR | Qty: 5

## 2022-05-17 MED FILL — SUCCINYLCHOLINE CHLORIDE 20 MG/ML IJ SOLN: 20 MG/ML | INTRAMUSCULAR | Qty: 10

## 2022-05-17 MED FILL — NICOTINE 14 MG/24HR TD PT24: 14 MG/24HR | TRANSDERMAL | Qty: 1

## 2022-05-17 MED FILL — ONDANSETRON HCL 4 MG/2ML IJ SOLN: 4 MG/2ML | INTRAMUSCULAR | Qty: 2

## 2022-05-17 MED FILL — CEFAZOLIN 3000 MG IN NS 100 ML IVPB: Qty: 100

## 2022-05-17 NOTE — Anesthesia Pre-Procedure Evaluation (Signed)
Department of Anesthesiology  Preprocedure Note       Name:  Garrett Manning   Age:  56 y.o.  DOB:  07/08/1966                                          MRN:  [id number]         Date:  05/17/2022      Surgeon: Moishe Spice):  Cl[facility], MD    Procedure: Procedure(s):  CYSTOSCOPY TRANSURETHRAL RESECTION PROSTATE    Medications prior to admission:   Prior to Admission medications    Medication Sig Start Date End Date Taking? Authorizing Provider   metFORMIN (GLUCOPHAGE-XR) 750 MG extended release tablet Take 1 tablet by mouth daily (with breakfast)   Yes Historical Provider, MD   Semaglutide, 1 MG/DOSE, (OZEMPIC, 1 MG/DOSE,) 2 MG/1.5ML SOPN Inject into the skin   Yes Historical Provider, MD   tamsulosin (FLOMAX) 0.4 MG capsule Take 1 capsule by mouth in the morning and at bedtime   Yes Historical Provider, MD   ibuprofen (ADVIL;MOTRIN) 600 MG tablet Take 1 tablet by mouth every 6 hours as needed for Pain   Yes Historical Provider, MD   baclofen (LIORESAL) 10 MG tablet Take 1 tablet by mouth 3 times daily   Yes Historical Provider, MD   finasteride (PROSCAR) 5 MG tablet Take 1 tablet by mouth daily   Yes Historical Provider, MD   celecoxib (CELEBREX) 200 MG capsule Take 1 capsule by mouth daily    Historical Provider, MD       Current medications:    Current Facility-Administered Medications   Medication Dose Route Frequency Provider Last Rate Last Admin   . sodium chloride flush 0.9 % injection 5-40 mL  5-40 mL IntraVENous 2 times per day [facility], MD       . sodium chloride flush 0.9 % injection 5-40 mL  5-40 mL IntraVENous PRN [facility], MD       . 0.9 % sodium chloride infusion   IntraVENous PRN [facility], MD       . ceFAZolin (ANCEF) 3000 mg in sodium chloride 0.9% 100 mL IVPB  3,000 mg IntraVENous On Call to OR [facility], MD       . sodium chloride flush 0.9 % injection 5-40 mL  5-40 mL IntraVENous 2 times per day Clarisse Gouge, MD        . lactated ringers IV soln infusion   IntraVENous Continuous Clarisse [facility], MD 30 mL/hr at 05/17/22 0648 New Bag at 05/17/22 1660       Allergies:  No Known Allergies    Problem List:    Patient Active Problem List   Diagnosis Code   . BPH with urinary obstruction N40.1, N13.8       Past Medical History:        Diagnosis Date   . Back pain    . Diabetes ([facility])    . Enlarged prostate        Past Surgical History:        Procedure Laterality Date   . FOOT SURGERY Bilateral    . KNEE ARTHROSCOPY Right    . TONSILLECTOMY         Social History:    Social History     Tobacco Use   . Smoking status: Every Day  Packs/day: 1.00     Types: Cigarettes   . Smokeless tobacco: Not on file   Substance Use Topics   . Alcohol use: Never                                Ready to quit: Not Answered  Counseling given: Not Answered      Vital Signs (Current):   Vitals:    05/09/22 1409 05/17/22 0622   BP:  137/88   Pulse:  91   Resp:  20   Temp:  97.6 F (36.4 C)   TempSrc:  Oral   SpO2:  96%   Weight: 259 lb (117.5 kg) 253 lb 4.9 oz (114.9 kg)   Height: 6\' 1"  (1.854 m) 6\' 1"  (1.854 m)                                              BP Readings from Last 3 Encounters:   05/17/22 137/88   05/28/20 138/88   05/25/20 135/80       NPO Status: Time of last liquid consumption: 2359                        Time of last solid consumption: 1800                        Date of last liquid consumption: 05/16/22                        Date of last solid food consumption: 05/16/22    BMI:   Wt Readings from Last 3 Encounters:   05/17/22 253 lb 4.9 oz (114.9 kg)   05/28/20 290 lb (131.5 kg)     Body mass index is 33.42 kg/m.    CBC: No results found for: WBC, RBC, HGB, HCT, MCV, RDW, PLT    CMP: No results found for: NA, K, CL, CO2, BUN, CREATININE, GFRAA, AGRATIO, LABGLOM, GLUCOSE, GLU, PROT, CALCIUM, BILITOT, ALKPHOS, AST, ALT    POC Tests:   Recent Labs     05/17/22  0650   POCGLU 95.0       Coags: No results found for: PROTIME, INR,  APTT    HCG (If Applicable): No results found for: PREGTESTUR, PREGSERUM, HCG, HCGQUANT     ABGs: No results found for: PHART, PO2ART, PCO2ART, HCO3ART, BEART, O2SATART     Type & Screen (If Applicable):  No results found for: LABABO, LABRH    Drug/Infectious Status (If Applicable):  No results found for: HIV, HEPCAB    COVID-19 Screening (If Applicable): No results found for: COVID19        Anesthesia Evaluation    Airway: Mallampati: II          Dental:    (+) upper dentures      Pulmonary:Negative Pulmonary ROS and normal exam                               Cardiovascular:Negative CV ROS                      Neuro/Psych:   Negative Neuro/Psych ROS  GI/Hepatic/Renal:   (+) renal disease: kidney stones,           Endo/Other:    (+) Diabetes, .                 Abdominal: normal exam            Vascular: negative vascular ROS.         Other Findings:           Anesthesia Plan      general     ASA 3       Induction: intravenous.      Anesthetic plan and risks discussed with patient.      Plan discussed with CRNA.    Attending anesthesiologist reviewed and agrees with Preprocedure content                Delena Bali, MD   05/17/2022

## 2022-05-17 NOTE — Other (Signed)
Denies SI

## 2022-05-17 NOTE — Anesthesia Post-Procedure Evaluation (Signed)
Department of Anesthesiology  Postprocedure Note    Patient: Garrett Manning  MRN: [id number]  Birthdate: [birth year]  Date of evaluation: 05/17/2022      Procedure Summary     Date: 05/17/22 Room / Location: [facility] OR [facility] / [facility] MAIN OR    Anesthesia Start: 0759 Anesthesia Stop: 0918    Procedure: CYSTOSCOPY TRANSURETHRAL RESECTION PROSTATE Diagnosis:       Benign prostatic hyperplasia with urinary retention      (Benign prostatic hyperplasia with urinary retention [N40.1, R33.8])    Surgeons: Clarisse Gouge, MD Responsible Provider: Delena Bali, MD    Anesthesia Type: general ASA Status: 3          Anesthesia Type: No value filed.    Aldrete Phase I: Aldrete Score: 8    Aldrete Phase II: Aldrete Score: 8      Anesthesia Post Evaluation    Patient location during evaluation: PACU  Patient participation: complete - patient participated  Level of consciousness: awake and alert  Pain score: 1  Airway patency: patent  Complications: no  Cardiovascular status: blood pressure returned to baseline  Respiratory status: acceptable  Hydration status: euvolemic

## 2022-05-17 NOTE — Op Note (Signed)
Operative Note      Patient: Garrett Manning  Date of Birth: [birth year]  MRN: [id number]    Date of Procedure: 06/02/22    Pre-Op Diagnosis Codes:     * Benign prostatic hyperplasia with urinary retention [N40.1, R33.8]    Post-Op Diagnosis: Same       Procedure(s):  CYSTOSCOPY TRANSURETHRAL RESECTION PROSTATE    Surgeon(s):  Cl[facility]isse [facility], MD    Assistant:   * No surgical staff found *    Anesthesia: General    Estimated Blood Loss (mL): less than 50     Complications: None    Specimens:   ID Type Source Tests Collected by Time Destination   A : Prostate Chips Tissue Prostate SURGICAL PATHOLOGY [facility], MD 06/02/2022 [phone]        Implants:  * No implants in log *      Drains:   Closed/Suction Drain Groin (Active)       Findings: Large obstructing prostate with intravesical lobe extension 3+ trabeculated bladder        Detailed Description of Procedure:   Brought into the cystoscopy room general anesthesia was established in the supine position a standard timeout was done he was given preoperative antibiotics placed in low lithotomy position penis during her sterilely prepped and draped cystoscopy was carried out with the visual obturator through the resectoscope urethra was normal the prostate was completely obstructed with a large median lobe which extended into the bladder and 2 large lateral lobes probably 60 to 70 g of tissue bladder had 3+ trabeculation no other lesions mucosa looked normal the trigone was difficult to visualize over the median lobe but was away from the adenoma I initially took down the median lobe to flatten out the floor from the bladder neck to the verumontanum resecting the intravesical portion of the gland we then addressed the left and then the right lateral lobes resecting from the bladder neck to the verumontanum taking the tissue down to circular fibers there was a lot of prostatic fluid it was trapped and drained during the procedure no significant stones were  freed up there was some moderate vascularity once the prostatic fossa was open we carefully fulgurated all bleeders and then swept all the chips out of the bladder the trigone was uninjured and well away from the resection the prostatic fossa was open from the bladder neck to the verumontanum we remove the resectoscope and placed an irrigating 24 French hematuria catheter which also irrigated clear patient was stable throughout the case and was transferred to recovery room in stable condition    Electronically signed by [facility], MD on June 02, 2022 at 9:26 AM

## 2022-05-17 NOTE — Anesthesia Procedure Notes (Signed)
Airway  Date/Time: 05/17/2022 8:09 AM  Urgency: elective    Airway not difficult    General Information and Staff    Patient location during procedure: OR  Anesthesiologist: Delena Bali, MD  Resident/CRNA: Dayton Bailiff, APRN - CRNA  Performed: resident/CRNA     Indications and Patient Condition  Indications for airway management: anesthesia  Spontaneous ventilation: present  Sedation level: deep  Preoxygenated: yes  Patient position: sniffing  MILS maintained throughout  Mask difficulty assessment: not attempted    Final Airway Details  Final airway type: endotracheal airway      Successful airway: ETT  Cuffed: yes   Successful intubation technique: direct laryngoscopy  Facilitating devices/methods: intubating stylet  Endotracheal tube insertion site: oral  Blade: Miller  Blade size: #2  ETT size (mm): 7.5  Cormack-Lehane Classification: grade I - full view of glottis  Placement verified by: chest auscultation, capnometry and palpation of cuff   Inital cuff pressure (cm H2O): 5  Measured from: lips  ETT to lips (cm): 23  Number of attempts at approach: 1

## 2022-05-17 NOTE — Anesthesia Post-Procedure Evaluation (Signed)
Department of Anesthesiology  Postprocedure Note    Patient: Garrett Manning  MRN: [id number]  Birthdate: 04/08/1966  Date of evaluation: 05/17/2022      Procedure Summary     Date: 05/17/22 Room / Location: [facility] / [facility] OR    Anesthesia Start: 0759 Anesthesia Stop: 0918    Procedure: CYSTOSCOPY TRANSURETHRAL RESECTION PROSTATE Diagnosis:       Benign prostatic hyperplasia with urinary retention      (Benign prostatic hyperplasia with urinary retention [N40.1, R33.8])    Surgeons: [facility], MD Responsible Provider: Delena Bali, MD    Anesthesia Type: general ASA Status: 3          Anesthesia Type: No value filed.    Aldrete Phase I: Aldrete Score: 8    Aldrete Phase II: Aldrete Score: 8      Anesthesia Post Evaluation

## 2022-05-17 NOTE — Consults (Signed)
Hospital Medicine Consultation            Date: 05/17/2022        Patient Name: Garrett Manning     Date of Birth: Jul 21, 1966      Age:  56 y.o.    Chief Complaint     I have a big prostate    Reason for Consult   Type 2 diabetes management    History of Present Illness   Patient is a 56 year old male with BPH, hyperlipidemia, type 2 diabetes, smoker who was admitted by urology today for an elective TURP.  Patient had an uneventful surgery.  He had quite a bit of prostate tissue and is currently on CBI.  We are asked to see the patient for type 2 diabetes.  He currently takes Ozempic and metformin.  His sugars have been in the 90s on a couple checks here in the hospital.  He is actually from [facility] and states that the medical care out there "sucks" and "no one calls you back to make an appointment" so he met his urologist here while on vacation when he could not urinate and through in the ER visit.  He states he was told that he will probably be hospitalized for couple days and be discharged on Saturday.  He still smokes, has tried Chantix in the past but still has the urge.  He is agreeable to a nicotine patch.    All other systems reviewed and negative unless noted in HPI.    Past Medical History    has a past medical history of Back pain, Diabetes ([place]), and Enlarged prostate.     Past Surgical History    has a past surgical history that includes Foot surgery (Bilateral); Knee arthroscopy (Right); Tonsillectomy; and TURP (N/A, 05/17/2022).     Medications Prior to Admission     Prior to Admission medications    Medication Sig Start Date End Date Taking? Authorizing Provider   metFORMIN (GLUCOPHAGE-XR) 750 MG extended release tablet Take 1 tablet by mouth daily (with breakfast)   Yes Historical Provider, MD   Semaglutide, 1 MG/DOSE, (OZEMPIC, 1 MG/DOSE,) 2 MG/1.5ML SOPN Inject into the skin   Yes Historical Provider, MD   tamsulosin (FLOMAX) 0.4 MG capsule Take 1 capsule by mouth in the morning and at bedtime   Yes  Historical Provider, MD   ibuprofen (ADVIL;MOTRIN) 600 MG tablet Take 1 tablet by mouth every 6 hours as needed for Pain   Yes Historical Provider, MD   baclofen (LIORESAL) 10 MG tablet Take 1 tablet by mouth 3 times daily   Yes Historical Provider, MD   finasteride (PROSCAR) 5 MG tablet Take 1 tablet by mouth daily   Yes Historical Provider, MD   celecoxib (CELEBREX) 200 MG capsule Take 1 capsule by mouth daily    Historical Provider, MD        Allergies   Patient has no known allergies.    Social History    reports that he has been smoking cigarettes. He has been smoking an average of 1 pack per day. He does not have any smokeless tobacco history on file. He reports that he does not drink alcohol and does not use drugs.     Family History   His mother died of a aneurysm at age 9.  He never knew his father    Physical Exam   BP 107/67   Pulse 77   Temp 97.7 F (36.5 C)   Resp 18  Ht _0  (1.854 m)   Wt 253 lb 4.9 oz (114.9 kg)   SpO2 95%   BMI 33.42 kg/m     General: Well developed, well nourished; no acute distress  HEENT: Atraumatic, normocephalic. Oropharynx clear, mucous membranes moist. No JVD, trachea midline, no thyromegaly.  CV: Normal rate and regular rhythm; no murmur, rubs or gallops. Normal S1/S2. No carotid bruit.   Lungs: Clear to auscultation bilaterally; good air movement; no wheezes/rhonchi/crackles  Abdomen: soft/non-tender/non-distended/normal active bowel sounds.  Foley in place with CBI running and draining cherry red urine  Extremities: no cyanosis, clubbing or edema  Musculoskeletal: normal bulk and tone.  Skin: no major lesions or rashes.   Psychiatric: AOx3.  Neuro: strength equal b/l, symmetric smile    Labs      Recent Results (from the past 24 hour(s))   CBC    Collection Time: 05/17/22  6:48 AM   Result Value Ref Range    WBC 12.3 (H) 3.8 - 10.6 x10e3/mcL    RBC 5.63 (H) 4.00 - 5.60 x10e6/mcL    Hemoglobin 16.8 13.0 - 17.3 g/dL    Hematocrit 49.1 38.0 - 52.0 %    MCV 87.2  84.0 - 100.0 fL    MCH 29.8 27.0 - 34.5 pg    MCHC 34.2 32.0 - 36.0 g/dL    RDW 13.5 11.0 - 16.0 %    Platelets 309 140 - 440 x10e3/mcL    MPV 9.9 7.2 - 13.2 fL    NRBC Automated 0.0 0.0 - 0.2 %    NRBC Absolute 0.000 0.000 - 0.012 x10e3/mcL   POCT Glucose    Collection Time: 05/17/22  6:50 AM   Result Value Ref Range    POC Glucose 95.0 65.0 - 110.0 mg/dL   POCT Glucose    Collection Time: 05/17/22  9:52 AM   Result Value Ref Range    POC Glucose 92.0 65.0 - 110.0 mg/dL        Imaging/Diagnostics Last 24 Hours   Sugars reviewed  Urology op note reviewed    Assessment and Plan     1.  Noninsulin-dependent type 2 diabetes  - Takes Ozempic and metformin as an outpatient.  He will take his Ozempic either Friday or Saturday himself  - Give low-dose correction with Accu-Cheks, check A1c    2.  Hyperlipidemia  - Continue statin    3.  BPH status post TURP  - Continue finasteride and Flomax and management per urology    4.  Smoker  - Give nicotine patch.  I counseled him for 3 minutes on smoking cessation    We will continue to follow with you    ADULT DIET; Regular; 4 carb choices (60 gm/meal)  DVT prophylaxis:  Per primary  Full Code    Consultations Ordered:  IP CONSULT TO HOSPITALIST    Electronically signed by Artis Flock, MD on 05/17/22 at 4:10 PM EDT

## 2022-05-18 LAB — POCT GLUCOSE
POC Glucose: 168 mg/dL — ABNORMAL HIGH (ref 65.0–110.0)
POC Glucose: 122 mg/dL — ABNORMAL HIGH (ref 65.0–110.0)
POC Glucose: 96 mg/dL (ref 65.0–110.0)
POC Glucose: 95 mg/dL (ref 65.0–110.0)

## 2022-05-18 LAB — BASIC METABOLIC PANEL
Anion Gap: 11 mmol/L (ref 2–17)
BUN: 16 mg/dL (ref 6–20)
CO2: 23 mmol/L (ref 22–29)
Calcium: 8.8 mg/dL (ref 8.6–10.0)
Chloride: 104 mmol/L (ref 98–107)
Creatinine: 0.8 mg/dL (ref 0.7–1.3)
Est, Glom Filt Rate: 103 mL/min/1.73m (ref >=60)
Glucose: 121 mg/dL — ABNORMAL HIGH (ref 70–99)
OSMOLALITY CALCULATED: 276 mOsm/kg (ref 270–287)
Potassium: 4 mmol/L (ref 3.5–5.3)
Sodium: 137 mmol/L (ref 135–145)

## 2022-05-18 LAB — HEMOGLOBIN AND HEMATOCRIT
Hematocrit: 43.8 % (ref 38.0–52.0)
Hemoglobin: 14.9 g/dL (ref 13.0–17.3)

## 2022-05-18 LAB — HEMOGLOBIN A1C
Est. Avg. Glucose, WB: 128
Est. Avg. Glucose-calculated: 140
Hemoglobin A1C: 6.1 % — ABNORMAL HIGH (ref 4.0–6.0)

## 2022-05-18 MED FILL — TAMSULOSIN HCL 0.4 MG PO CAPS: 0.4 MG | ORAL | Qty: 1

## 2022-05-18 MED FILL — BD POSIFLUSH 0.9 % IV SOLN: 0.9 % | INTRAVENOUS | Qty: 10

## 2022-05-18 MED FILL — MORPHINE SULFATE 2 MG/ML IJ SOLN: 2 mg/mL | INTRAMUSCULAR | Qty: 1

## 2022-05-18 MED FILL — HYDROMORPHONE HCL 2 MG PO TABS: 2 MG | ORAL | Qty: 1

## 2022-05-18 MED FILL — NICOTINE 14 MG/24HR TD PT24: 14 MG/24HR | TRANSDERMAL | Qty: 1

## 2022-05-18 MED FILL — PRAVASTATIN SODIUM 20 MG PO TABS: 20 MG | ORAL | Qty: 1

## 2022-05-18 MED FILL — FINASTERIDE 5 MG PO TABS: 5 MG | ORAL | Qty: 1

## 2022-05-18 NOTE — Progress Notes (Signed)
[facility] Hospitalist Service         Hospitalist Progress Note   Admit Date:  05/17/2022  6:03 AM   Name:  Garrett Manning   Age:  56 y.o.  Sex:  male  DOB:  07/31/1966   MRN:  [id number]   Room:  0336/01    Subjective & 24hr Events (05/18/22):     Hospitalist consult follow-up  Blood glucose has been well controlled, from 92-168  CBI discontinued today, Foley catheter kept in place.    We discussed smoking cessation again.  He remains in the precontemplative stage    Overnight activity reviewed with nursing.    Assessment & Plan:     Hospital Problems             Last Modified POA    * (Principal) BPH with urinary obstruction 05/17/2022 Yes     Type 2 diabetes mellitus: Well controlled with A1c 6.1%  -Continue SSI.  -He will resume metformin and Ozempic after discharge.    Tobacco abuse:  -Nicotine patch  -Cessation encouraged again    BPH s/p TURP:  Postoperative day #1.  Management per urology team      Diet:  ADULT DIET; Regular; 4 carb choices (60 gm/meal)  DVT PPx: SCDs with hematuria  Code status: Full Code    Objective:   Blood pressure 103/75, pulse 85, temperature 98.3 F (36.8 C), temperature source Oral, resp. rate 18, height '6\' 1"'  (1.854 m), weight 255 lb (115.7 kg), SpO2 95 %.  Body mass index is 33.64 kg/m.    Oxygen Therapy  SpO2: 95 %  Pulse Oximeter Device Mode: Continuous  Pulse Oximeter Device Location: Finger  O2 Device: None (Room air)  O2 Flow Rate (L/min): 2 L/min      Intake/Output Summary (Last 24 hours) at 05/18/2022 1501  Last data filed at 05/18/2022 1200  Gross per 24 hour   Intake --   Output 5750 ml   Net -5750 ml         Physical Exam:  Moderately obese man in no distress  Foley catheter in place with yellow urine  Regular rate rhythm without murmur  Lungs are clear bilaterally    I have personally reviewed labs and tests showing:  Recent Labs:  Hematologic/Coags Chemistries   Recent Labs     05/17/22  0648 05/18/22  0601   WBC 12.3*  --    HGB 16.8 14.9   HCT 49.1 43.8   PLT 309  --      No  results found for: PROT, ALBUMIN  No components found for: HGBA1C  No results found for: INR, PROTIME  No results found for: APTT  No results found for: DDIMER   Recent Labs     05/18/22  0601   NA 137   K 4.0   CL 104   CO2 23   BUN 16   CREATININE 0.8     No results for input(s): GLU in the last 72 hours.  No results found for: CPK, CKMB, TROPONINI  No results found for: IRON, FERRITIN     Inflammatory/Respiratory Diabetes   No results found for: CRP  No results found for: ESR  ABGs:  No results found for: PHART, PO2ART, HCO3, PCO2ART   Lab Results   Component Value Date/Time    CREATININE 0.8 05/18/2022 06:01 AM                I have personally reviewed imaging studies showing:  Other Studies:  No orders to display       Current Meds:   sodium chloride flush  5-40 mL IntraVENous 2 times per day    pravastatin  10 mg Oral Nightly    finasteride  5 mg Oral Daily    tamsulosin  0.4 mg Oral Daily    insulin lispro  0-8 Units SubCUTAneous TID WC    insulin lispro  0-4 Units SubCUTAneous Nightly    nicotine  1 patch TransDERmal Daily       lactated ringers IV soln      sodium chloride 75 mL/hr at 05/18/22 0159    sodium chloride      dextrose        (Note: the above list excludes PRNs)    Signed:  Edyth Gunnels, MD    ++++++++++++++++++++++++++++++++++++++++    This note was created using voice recognition software and may contain typographic errors missed during final review. The intent is to have a complete and accurate medical record.   As a valued partner in this safety effort, if you have noted factual errors, please complete the Health Information Amendment/Correct Form or call the [place] Management Office at [phone].

## 2022-05-18 NOTE — Care Coordination-Inpatient (Signed)
05/18/22 1354   Service Assessment   Patient Orientation Alert and Oriented   Cognition Alert   History Provided By Medical Record   Primary Caregiver Self   Support Systems Spouse/Significant Other   PCP Verified by CM No   Prior Functional Level Independent in ADLs/IADLs   Can patient return to prior living arrangement Yes   Ability to make needs known: Good   Family able to assist with home care needs: Yes   Would you like for me to discuss the discharge plan with any other family members/significant others, and if so, who? Yes  Carolee Rota (Wife))   Social/Functional History   ADL Assistance Independent   Mudlogger Yes   Ambulation Assistance Independent   Transfer Assistance Independent   Active Driver Yes   Discharge Planning   Patient expects to be discharged to: House     Pt admitted for BPH with urinary obstruction. CM working remotely and unable to reach pt by phone to complete IA. Assessment completed via chart review.

## 2022-05-18 NOTE — Progress Notes (Signed)
Progress Note    Date:05/18/2022       Room:0336/01  Patient Name:Garrett Manning     Date of Birth:[birth year]     Age:56 y.o.    Subjective       Urine clear today but had to run CBI fairly strongly overnight due to moderate bleeding will try to wean today patient has no complaints but likely we will not get a voiding trial till tomorrow      Physical Examination      Vitals:  BP 112/70   Pulse (!) 101   Temp 98.4 F (36.9 C) (Oral)   Resp 18   Ht 6\' 1"  (1.854 m)   Wt 255 lb (115.7 kg)   SpO2 96%   BMI 33.64 kg/m   Temp (24hrs), Avg:98.1 F (36.7 C), Min:97.7 F (36.5 C), Max:98.4 F (36.9 C)      I/O (24Hr):    Intake/Output Summary (Last 24 hours) at 05/18/2022 0923  Last data filed at 05/18/2022 0901  Gross per 24 hour   Intake 120 ml   Output 6600 ml   Net -6480 ml       Abdomen soft urine clear cherry in the bag    Labs/Imaging/Diagnostics   Labs:  CBC:  Recent Labs     05/17/22  0648 05/18/22  0601   WBC 12.3*  --    RBC 5.63*  --    HGB 16.8 14.9   HCT 49.1 43.8   MCV 87.2  --    RDW 13.5  --    PLT 309  --      CHEMISTRIES:  Recent Labs     05/18/22  0601   NA 137   K 4.0   CL 104   CO2 23   BUN 16   CREATININE 0.8   GLUCOSE 121*     PT/INR:No results for input(s): PROTIME, INR in the last 72 hours.  APTT:No results for input(s): APTT in the last 72 hours.  LIVER PROFILE:No results for input(s): AST, ALT, BILIDIR, BILITOT, ALKPHOS in the last 72 hours.    Imaging Last 24 Hours:  No results found.      Assessment        Hospital Problems             Last Modified POA    * (Principal) BPH with urinary obstruction 05/17/2022 Yes       Plan:        Continue to wean the CBI voiding trial with urine stays clear off CBI    Electronically signed by 05/19/2022, MD on 05/18/22 at 9:23 AM EDT

## 2022-05-19 LAB — POCT GLUCOSE
POC Glucose: 120 mg/dL — ABNORMAL HIGH (ref 65.0–110.0)
POC Glucose: 141 mg/dL — ABNORMAL HIGH (ref 65.0–110.0)
POC Glucose: 102 mg/dL (ref 65.0–110.0)

## 2022-05-19 LAB — BASIC METABOLIC PANEL
Anion Gap: 8 mmol/L (ref 2–17)
BUN: 11 mg/dL (ref 6–20)
CO2: 25 mmol/L (ref 22–29)
Calcium: 8.6 mg/dL (ref 8.6–10.0)
Chloride: 104 mmol/L (ref 98–107)
Creatinine: 0.8 mg/dL (ref 0.7–1.3)
Est, Glom Filt Rate: 103 mL/min/1.73m (ref >=60)
Glucose: 134 mg/dL — ABNORMAL HIGH (ref 70–99)
OSMOLALITY CALCULATED: 275 mOsm/kg (ref 270–287)
Potassium: 3.7 mmol/L (ref 3.5–5.3)
Sodium: 137 mmol/L (ref 135–145)

## 2022-05-19 LAB — HEMOGLOBIN AND HEMATOCRIT
Hematocrit: 44.2 % (ref 38.0–52.0)
Hemoglobin: 14.9 g/dL (ref 13.0–17.3)

## 2022-05-19 MED FILL — TAMSULOSIN HCL 0.4 MG PO CAPS: 0.4 MG | ORAL | Qty: 1

## 2022-05-19 MED FILL — MORPHINE SULFATE 2 MG/ML IJ SOLN: 2 mg/mL | INTRAMUSCULAR | Qty: 1

## 2022-05-19 MED FILL — HYDROMORPHONE HCL 2 MG PO TABS: 2 MG | ORAL | Qty: 1

## 2022-05-19 MED FILL — FINASTERIDE 5 MG PO TABS: 5 MG | ORAL | Qty: 1

## 2022-05-19 MED FILL — BD POSIFLUSH 0.9 % IV SOLN: 0.9 % | INTRAVENOUS | Qty: 10

## 2022-05-19 MED FILL — PRAVASTATIN SODIUM 20 MG PO TABS: 20 MG | ORAL | Qty: 1

## 2022-05-19 MED FILL — NICOTINE 14 MG/24HR TD PT24: 14 MG/24HR | TRANSDERMAL | Qty: 1

## 2022-05-19 NOTE — Care Coordination-Inpatient (Signed)
05/18/22 1354   Service Assessment   Patient Orientation Alert and Oriented   Cognition Alert   History Provided By Medical Record   Primary Caregiver Self   Support Systems Spouse/Significant Other   PCP Verified by CM No   Prior Functional Level Independent in ADLs/IADLs   Can patient return to prior living arrangement Yes   Ability to make needs known: Good   Family able to assist with home care needs: Yes   Would you like for me to discuss the discharge plan with any other family members/significant others, and if so, who? Yes  Carolee Rota (Wife))   Social/Functional History   ADL Assistance Independent   Mudlogger Yes   Ambulation Assistance Independent   Transfer Assistance Independent   Active Driver Yes   Discharge Planning   Patient expects to be discharged to: House     Pt admitted for BPH with urinary obstruction. CM working remotely and unable to reach pt by phone to complete IA. Assessment completed via chart review.     05/19/22 Received order for DC home; patient has no new meds and has instructions for follow up.

## 2022-05-19 NOTE — Discharge Summary (Signed)
Discharge Summary    Date: 05/22/2022  Patient Name: Garrett Bienenstockimmy Manning    Date of Birth: [birth year]     Age: 56 y.o.    Admit Date: 05/17/2022  Discharge Date:  Discharge Condition:    Admission Diagnosis  Benign prostatic hyperplasia with urinary retention [N40.1, R33.8];BPH with urinary obstruction [N40.1, N13.8];S/P transurethral resection of prostate [Z90.79]      Discharge Diagnosis  Principal Problem (Resolved):    BPH with urinary obstruction  Active Problems:    S/P transurethral resection of prostate      Hospital Stay  Narrative of Hospital Course:      Consultants:  IP CONSULT TO HOSPITALIST    Surgeries/procedures Performed:      Treatments:            Discharge Plan/Disposition:  Home    Hospital/Incidental Findings Requiring Follow Up:    Patient Instructions:    Diet:    Activity:  For number of days (if applicable):      Other Instructions:    Provider Follow-Up:   No follow-ups on file.     Significant Diagnostic Studies:    Recent Labs:  Admission on 05/17/2022, Discharged on 05/19/2022  WBC                                           Date: 05/17/2022  Value: 12.3 (H)    Ref range: 3.8 - 10.6 x10e3*  Status: Final  RBC                                           Date: 05/17/2022  Value: 5.63 (H)    Ref range: 4.00 - 5.60 x10e*  Status: Final  Hemoglobin                                    Date: 05/17/2022  Value: 16.8        Ref range: 13.0 - 17.3 g/dL   Status: Final  Hematocrit                                    Date: 05/17/2022  Value: 49.1        Ref range: 38.0 - 52.0 %      Status: Final  MCV                                           Date: 05/17/2022  Value: 87.2        Ref range: 84.0 - 100.0 fL    Status: Final  MCH                                           Date: 05/17/2022  Value: 29.8        Ref range: 27.0 - 34.5 pg     Status: Final  MCHC  Date: 05/17/2022  Value: 34.2        Ref range: 32.0 - 36.0 g/dL   Status: Final  RDW                                            Date: 05/17/2022  Value: 13.5        Ref range: 11.0 - 16.0 %      Status: Final  Platelets                                     Date: 05/17/2022  Value: 309         Ref range: 140 - 440 x10e3/*  Status: Final  MPV                                           Date: 05/17/2022  Value: 9.9         Ref range: 7.2 - 13.2 fL      Status: Final  NRBC Automated                                Date: 05/17/2022  Value: 0.0         Ref range: 0.0 - 0.2 %        Status: Final  NRBC Absolute                                 Date: 05/17/2022  Value: 0.000       Ref range: 0.000 - 0.012 x1*  Status: Final  POC Glucose                                   Date: 05/17/2022  Value: 95.0        Ref range: 65.0 - 110.0 mg/*  Status: Final                Comment: SF - AMB PACU OR  POC Glucose                                   Date: 05/17/2022  Value: 92.0        Ref range: 65.0 - 110.0 mg/*  Status: Final                Comment: SF - AMB PACU OR  POC Glucose                                   Date: 05/17/2022  Value: 144.0 (H)   Ref range: 65.0 - 110.0 mg/*  Status: Final                Comment: SF - 3 POD  Hemoglobin A1C  Date: 05/18/2022  Value: 6.1 (H)     Ref range: 4.0 - 6.0 %        Status: Final                Comment: HEMOGLOBIN A1C INTERPRETATION:    The following arbitrary ranges may be used for interpretation of the results.  However, factors such as duration of diabetes, adherence to therapy, and  patient age should also be considered in assessing degree of blood glucose  control.    Hemoglobin A1C                 Avg. Blood Sugar  --------------------------------------------------------------  6%                           135 mg/dL  7%                           170 mg/dL  8%                           205 mg/dL  9%                           240 mg/dL  28%                          275 mg/dL    ======================================================    A1C                      Glucose  Control  ----------------------------------------------------------------  < 6.0 %                   Normal  6.0 - 6.9 %               Abnormal  7.0 - 7.9 %               Sub-Optimal Control  > 8.0 %                   Inadequate Control    Est. Avg. Glucose, WB                         Date: 05/18/2022  Value: 128           Status: Final  Est. Avg. Glucose-calculated                  Date: 05/18/2022  Value: 140           Status: Final  Sodium                                        Date: 05/18/2022  Value: 137         Ref range: 135 - 145 mmol/L   Status: Final  Potassium                                     Date: 05/18/2022  Value: 4.0         Ref range: 3.5 - 5.3 mmol/L   Status: Final  Chloride  Date: 05/18/2022  Value: 104         Ref range: 98 - 107 mmol/L    Status: Final  CO2                                           Date: 05/18/2022  Value: 23          Ref range: 22 - 29 mmol/L     Status: Final  Glucose                                       Date: 05/18/2022  Value: 121 (H)     Ref range: 70 - 99 mg/dL      Status: Final  BUN                                           Date: 05/18/2022  Value: 16          Ref range: 6 - 20 mg/dL       Status: Final  Creatinine                                    Date: 05/18/2022  Value: 0.8         Ref range: 0.7 - 1.3 mg/dL    Status: Final  Anion Gap                                     Date: 05/18/2022  Value: 11          Ref range: 2 - 17 mmol/L      Status: Final  OSMOLALITY CALCULATED                         Date: 05/18/2022  Value: 276         Ref range: 270 - 287 mOsm/kg  Status: Final  Calcium                                       Date: 05/18/2022  Value: 8.8         Ref range: 8.6 - 10.0 mg/dL   Status: Final  Est, Glom Filt Rate                           Date: 05/18/2022  Value: 103         Ref range: >=60 mL/min/1.73*  Status: Final                Comment: VERIFIED by Discern Expert.  GFR Interpretation:                                                                          %  OF  KIDNEY  GFR                                                        STAGE  FUNCTION  ==================================================================================    > 90        Normal kidney function                       STAGE 1  90-100%  89 to 60      Mild loss of kidney function                 STAGE 2  80-60%  59 to 45      Mild to moderate loss of kidney function     STAGE 3a  59-45%  44 to 30      Moderate to severe loss of kidney function   STAGE 3b  44-30%  29 to 15      Severe loss of kidney function               STAGE 4  29-15%    < 15        Kidney failure                               STAGE 5  <15%  ==================================================================================  Modified from [employer]    GFR Calculation performed using the CKD-EPI 2021 equation developed for use  with IDMS traceable creatinine methods and                          is the calculation recommended by  the [facility] for estimating GFR in adults.    Hemoglobin                                    Date: 05/18/2022  Value: 14.9        Ref range: 13.0 - 17.3 g/dL   Status: Final  Hematocrit                                    Date: 05/18/2022  Value: 43.8        Ref range: 38.0 - 52.0 %      Status: Final  POC Glucose                                   Date: 05/17/2022  Value: 168.0 (H)   Ref range: 65.0 - 110.0 mg/*  Status: Final                Comment: SF - 3 POD  POC Glucose                                   Date: 05/18/2022  Value: 122.0 (H)   Ref range: 65.0 - 110.0 mg/*  Status: Final  Comment: SF - 3 POD  POC Glucose                                   Date: 05/18/2022  Value: 96.0        Ref range: 65.0 - 110.0 mg/*  Status: Final                Comment: SF - 3 POD  POC Glucose                                   Date: 05/18/2022  Value: 95.0        Ref range: 65.0 - 110.0 mg/*  Status: Final                Comment: SF - 3  POD  Sodium                                        Date: 05/19/2022  Value: 137         Ref range: 135 - 145 mmol/L   Status: Final  Potassium                                     Date: 05/19/2022  Value: 3.7         Ref range: 3.5 - 5.3 mmol/L   Status: Final  Chloride                                      Date: 05/19/2022  Value: 104         Ref range: 98 - 107 mmol/L    Status: Final  CO2                                           Date: 05/19/2022  Value: 25          Ref range: 22 - 29 mmol/L     Status: Final  Glucose                                       Date: 05/19/2022  Value: 134 (H)     Ref range: 70 - 99 mg/dL      Status: Final  BUN                                           Date: 05/19/2022  Value: 11          Ref range: 6 - 20 mg/dL       Status: Final  Creatinine                                    Date: 05/19/2022  Value: 0.8  Ref range: 0.7 - 1.3 mg/dL    Status: Final  Anion Gap                                     Date: 05/19/2022  Value: 8           Ref range: 2 - 17 mmol/L      Status: Final  OSMOLALITY CALCULATED                         Date: 05/19/2022  Value: 275         Ref range: 270 - 287 mOsm/kg  Status: Final  Calcium                                       Date: 05/19/2022  Value: 8.6         Ref range: 8.6 - 10.0 mg/dL   Status: Final  Est, Glom Filt Rate                           Date: 05/19/2022  Value: 103         Ref range: >=60 mL/min/1.73*  Status: Final                Comment: VERIFIED by Discern Expert.  GFR Interpretation:                                                                         % OF  KIDNEY  GFR                                                        STAGE  FUNCTION  ==================================================================================    > 90        Normal kidney function                       STAGE 1  90-100%  89 to 60      Mild loss of kidney function                 STAGE 2  80-60%  59 to 45      Mild to moderate loss of kidney function     STAGE  3a  59-45%  44 to 30      Moderate to severe loss of kidney function   STAGE 3b  44-30%  29 to 15      Severe loss of kidney function               STAGE 4  29-15%    < 15        Kidney failure  STAGE 5  <15%  ==================================================================================  Modified from [employer]    GFR Calculation performed using the CKD-EPI 2021 equation developed for use  with IDMS traceable creatinine methods and                          is the calculation recommended by  the [facility] for estimating GFR in adults.    Hemoglobin                                    Date: 05/19/2022  Value: 14.9        Ref range: 13.0 - 17.3 g/dL   Status: Final  Hematocrit                                    Date: 05/19/2022  Value: 44.2        Ref range: 38.0 - 52.0 %      Status: Final  POC Glucose                                   Date: 05/18/2022  Value: 120.0 (H)   Ref range: 65.0 - 110.0 mg/*  Status: Final                Comment: SF - 3 POD  POC Glucose                                   Date: 05/19/2022  Value: 141.0 (H)   Ref range: 65.0 - 110.0 mg/*  Status: Final                Comment: SF - 3 POD  POC Glucose                                   Date: 05/19/2022  Value: 102.0       Ref range: 65.0 - 110.0 mg/*  Status: Final                Comment: SF - 3 POD  ------------    Radiology last 7 days:  No results found.     @[email or web address] @    Discharge Medications    Discharge Medication List as of 05/19/2022 12:52 PM        Discharge Medication List as of 05/19/2022 12:52 PM        Discharge Medication List as of 05/19/2022 12:52 PM    CONTINUE these medications which have NOT CHANGED    celecoxib (CELEBREX) 200 MG capsule  Take 1 capsule by mouth daily  Historical Med    metFORMIN (GLUCOPHAGE-XR) 750 MG extended release tablet  Take 1 tablet by mouth daily (with breakfast)  Historical Med    Semaglutide, 1 MG/DOSE, (OZEMPIC, 1 MG/DOSE,) 2 MG/1.5ML  SOPN  Inject into the skin  Historical Med    tamsulosin (FLOMAX) 0.4 MG capsule  Take 1 capsule by mouth in the morning and at bedtime  Historical Med    ibuprofen (ADVIL;MOTRIN) 600 MG tablet  Take 1 tablet by mouth every 6 hours as needed for Pain  Historical Med    baclofen (LIORESAL) 10 MG tablet  Take 1 tablet by mouth 3 times daily  Historical Med    finasteride (PROSCAR) 5 MG tablet  Take 1 tablet by mouth daily  Historical Med          Discharge Medication List as of 05/19/2022 12:52 PM        Time Spent on Discharge:  minutes were spent in patient examination, evaluation, counseling as well as medication reconciliation, prescriptions for required medications, discharge plan, and follow up.    Electronically signed by Clarisse Gouge, MD on 05/22/22 at 12:40 PM EDT

## 2022-05-19 NOTE — Progress Notes (Signed)
[facility] Hospitalist Service         Hospitalist Progress Note   Admit Date:  05/17/2022  6:03 AM   Name:  Garrett Manning   Age:  56 y.o.  Sex:  male  DOB:  02-06-1966   MRN:  [id number]   Room:  0336/01    Subjective & 24hr Events (05/19/22):     Hospitalist consult follow-up  Blood glucose has been well controlled, from 96-120.     Foley removed this morning and he has voided, with some discomfort.  He anticipates discharge home today.    We discussed imperative to stop smoking and he remains uninterested in cessation.    Overnight activity reviewed with nursing.    Assessment & Plan:     Hospital Problems             Last Modified POA    S/P transurethral resection of prostate 05/19/2022 Yes     Type 2 diabetes mellitus: Well controlled with A1c 6.1%  -Continue SSI.  -He will resume metformin and Ozempic after discharge.    Tobacco abuse:  -Nicotine patch during inpatient stay  -Cessation encouraged again but he remains in precontemplative stage    BPH s/p TURP:  Postoperative day #2.  Management per urology team      Diet:  ADULT DIET; Regular; 4 carb choices (60 gm/meal)  DVT PPx: SCDs with hematuria  Code status: Full Code    Objective:   Blood pressure 120/87, pulse 71, temperature 97.8 F (36.6 C), temperature source Oral, resp. rate 18, height '6\' 1"'  (1.854 m), weight 256 lb (116.1 kg), SpO2 96 %.  Body mass index is 33.78 kg/m.    Oxygen Therapy  SpO2: 96 %  Pulse Oximeter Device Mode: Continuous  Pulse Oximeter Device Location: Finger  O2 Device: None (Room air)  O2 Flow Rate (L/min): 2 L/min      Intake/Output Summary (Last 24 hours) at 05/19/2022 1238  Last data filed at 05/19/2022 1134  Gross per 24 hour   Intake 500 ml   Output 3150 ml   Net -2650 ml         Physical Exam:  Moderately obese man in no distress  Regular rate rhythm without murmur  Lungs are clear bilaterally  Foley removed    I have personally reviewed labs and tests showing:  Recent Labs:  Hematologic/Coags Chemistries   Recent Labs      05/17/22  0648 05/18/22  0601 05/19/22  0642   WBC 12.3*  --   --    HGB 16.8 14.9 14.9   HCT 49.1 43.8 44.2   PLT 309  --   --      No results found for: PROT, ALBUMIN  No components found for: HGBA1C  No results found for: INR, PROTIME  No results found for: APTT  No results found for: DDIMER   Recent Labs     05/18/22  0601 05/19/22  0642   NA 137 137   K 4.0 3.7   CL 104 104   CO2 23 25   BUN 16 11   CREATININE 0.8 0.8     No results for input(s): GLU in the last 72 hours.  No results found for: CPK, CKMB, TROPONINI  No results found for: IRON, FERRITIN     Inflammatory/Respiratory Diabetes   No results found for: CRP  No results found for: ESR  ABGs:  No results found for: PHART, PO2ART, HCO3, PCO2ART   Lab  Results   Component Value Date/Time    CREATININE 0.8 05/19/2022 06:42 AM                I have personally reviewed imaging studies showing:  Other Studies:  No orders to display       Current Meds:   sodium chloride flush  5-40 mL IntraVENous 2 times per day    pravastatin  10 mg Oral Nightly    finasteride  5 mg Oral Daily    tamsulosin  0.4 mg Oral Daily    insulin lispro  0-8 Units SubCUTAneous TID WC    insulin lispro  0-4 Units SubCUTAneous Nightly    nicotine  1 patch TransDERmal Daily       lactated ringers IV soln      sodium chloride 75 mL/hr at 05/18/22 0159    sodium chloride      dextrose        (Note: the above list excludes PRNs)    Signed:  Edyth Gunnels, MD    ++++++++++++++++++++++++++++++++++++++++    This note was created using voice recognition software and may contain typographic errors missed during final review. The intent is to have a complete and accurate medical record.   As a valued partner in this safety effort, if you have noted factual errors, please complete the Health Information Amendment/Correct Form or call the [place] Management Office at [phone].

## 2022-05-19 NOTE — Plan of Care (Signed)
Problem: Chronic Conditions and Co-morbidities  Goal: Patient's chronic conditions and co-morbidity symptoms are monitored and maintained or improved  Outcome: Adequate for Discharge     Problem: Pain  Goal: Verbalizes/displays adequate comfort level or baseline comfort level  Outcome: Progressing     Problem: Safety - Adult  Goal: Free from fall injury  Outcome: Adequate for Discharge     Problem: Discharge Planning  Goal: Discharge to home or other facility with appropriate resources  Outcome: Adequate for Discharge

## 2022-10-05 LAB — CULTURE, URINE: FINAL REPORT: NO GROWTH

## 2023-07-18 ENCOUNTER — Encounter

## 2023-07-18 ENCOUNTER — Observation Stay
Admit: 2023-07-18 | Discharge: 2023-07-20 | Disposition: A | Discharge status: Home / Self Care | Payer: BLUE CROSS/BLUE SHIELD | Attending: Urology | Admitting: Urology

## 2023-07-18 DIAGNOSIS — N4830 Priapism, unspecified: Secondary | ICD-10-CM

## 2023-07-18 LAB — CBC WITH AUTO DIFFERENTIAL
Basophils %: 0.3 % (ref 0.0–2.0)
Basophils Absolute: 0.1 10*3/uL (ref 0.0–0.2)
Eosinophils %: 0.8 % (ref 0.0–7.0)
Eosinophils Absolute: 0.1 10*3/uL (ref 0.0–0.5)
Hematocrit: 42.3 % (ref 38.0–52.0)
Hemoglobin: 14.1 g/dL (ref 13.0–17.3)
Immature Grans (Abs): 0.1 10*3/uL — ABNORMAL HIGH (ref 0.00–0.06)
Immature Granulocytes %: 0.6 % (ref 0.0–0.6)
Lymphocytes Absolute: 1.3 10*3/uL (ref 1.0–3.2)
Lymphocytes: 7.4 % — ABNORMAL LOW (ref 15.0–45.0)
MCH: 29.5 pg (ref 27.0–34.5)
MCHC: 33.3 g/dL (ref 30.0–36.0)
MCV: 88.5 fL (ref 84.0–100.0)
MPV: 9.8 fL (ref 7.0–12.2)
Monocytes %: 8.8 % (ref 4.0–12.0)
Monocytes Absolute: 1.5 10*3/uL — ABNORMAL HIGH (ref 0.3–1.0)
NRBC Absolute: 0 10*3/uL (ref 0.000–0.012)
NRBC Automated: 0 % (ref 0.0–0.2)
Neutrophils %: 82.1 % — ABNORMAL HIGH (ref 42.0–74.0)
Neutrophils Absolute: 14 10*3/uL — ABNORMAL HIGH (ref 1.6–7.3)
Platelets: 244 10*3/uL (ref 140–440)
RBC: 4.78 x10e6/mcL (ref 4.00–5.60)
RDW: 15 % (ref 10.0–17.0)
WBC: 17 10*3/uL — ABNORMAL HIGH (ref 3.8–10.6)

## 2023-07-18 LAB — BASIC METABOLIC PANEL
Anion Gap: 13 mmol/L (ref 2–17)
BUN: 13 mg/dL (ref 6–20)
CO2: 22 mmol/L (ref 22–29)
Calcium: 9 mg/dL (ref 8.5–10.7)
Chloride: 101 mmol/L (ref 98–107)
Creatinine: 0.9 mg/dL (ref 0.7–1.3)
Est, Glom Filt Rate: 99 mL/min/1.73mÂ² (ref >=60)
Glucose: 108 mg/dL — ABNORMAL HIGH (ref 70–99)
Osmolaliy Calculated: 273 mosm/kg (ref 270–287)
Potassium: 3.5 mmol/L (ref 3.5–5.3)
Sodium: 136 mmol/L (ref 135–145)

## 2023-07-18 LAB — POCT GLUCOSE: POC Glucose: 110 mg/dL (ref 65.0–110.0)

## 2023-07-18 MED ORDER — POLYETHYLENE GLYCOL 3350 17 G PO PACK
17 g | Freq: Every day | ORAL | Status: AC | PRN
Start: 2023-07-18 — End: 2023-07-20
  Administered 2023-07-19: 21:00:00 17 g via ORAL

## 2023-07-18 MED ORDER — NORMAL SALINE FLUSH 0.9 % IV SOLN
0.9 | Freq: Two times a day (BID) | INTRAVENOUS | Status: DC
Start: 2023-07-18 — End: 2023-07-20
  Administered 2023-07-19 – 2023-07-20 (×3): 10 mL via INTRAVENOUS
  Administered 2023-07-20: 40 mL via INTRAVENOUS

## 2023-07-18 MED ORDER — PHENYLEPHRINE 250 MCG/ML 10 ML INJECTION (FOR PRIAPISM)
INTRAMUSCULAR | Status: DC | PRN
Start: 2023-07-18 — End: 2023-07-20

## 2023-07-18 MED ORDER — POTASSIUM CHLORIDE CRYS ER 20 MEQ PO TBCR
20 | ORAL | Status: DC | PRN
Start: 2023-07-18 — End: 2023-07-20

## 2023-07-18 MED ORDER — ONDANSETRON HCL 4 MG/2ML IJ SOLN
4 | Freq: Four times a day (QID) | INTRAMUSCULAR | Status: DC | PRN
Start: 2023-07-18 — End: 2023-07-20

## 2023-07-18 MED ORDER — SODIUM CHLORIDE 0.9 % IV SOLN
0.9 | INTRAVENOUS | Status: DC
Start: 2023-07-18 — End: 2023-07-19
  Administered 2023-07-18 – 2023-07-19 (×2): via INTRAVENOUS

## 2023-07-18 MED ORDER — ONDANSETRON 4 MG PO TBDP
4 | Freq: Three times a day (TID) | ORAL | Status: DC | PRN
Start: 2023-07-18 — End: 2023-07-20

## 2023-07-18 MED ORDER — FENTANYL CITRATE (PF) 100 MCG/2ML IJ SOLN
100 | Freq: Once | INTRAMUSCULAR | Status: AC
Start: 2023-07-18 — End: 2023-07-18
  Administered 2023-07-18: 20:00:00 50 ug via INTRAVENOUS

## 2023-07-18 MED ORDER — ONDANSETRON HCL 4 MG/2ML IJ SOLN
4 | Freq: Once | INTRAMUSCULAR | Status: AC
Start: 2023-07-18 — End: 2023-07-18
  Administered 2023-07-18: 20:00:00 4 mg via INTRAVENOUS

## 2023-07-18 MED ORDER — PHENYLEPHRINE 250 MCG/ML 10 ML INJECTION (FOR PRIAPISM)
INTRAMUSCULAR | Status: DC | PRN
Start: 2023-07-18 — End: 2023-07-18

## 2023-07-18 MED ORDER — SODIUM CHLORIDE 0.9 % IV SOLN
0.9 | INTRAVENOUS | Status: DC | PRN
Start: 2023-07-18 — End: 2023-07-20

## 2023-07-18 MED ORDER — POTASSIUM CHLORIDE 10 MEQ/100ML IV SOLN
10 | INTRAVENOUS | Status: DC | PRN
Start: 2023-07-18 — End: 2023-07-20

## 2023-07-18 MED ORDER — NORMAL SALINE FLUSH 0.9 % IV SOLN
0.9 % | INTRAVENOUS | Status: AC | PRN
Start: 2023-07-18 — End: 2023-07-20

## 2023-07-18 MED ORDER — HYDROMORPHONE HCL PF 2 MG/ML IJ SOLN
2 | INTRAMUSCULAR | Status: DC | PRN
Start: 2023-07-18 — End: 2023-07-20
  Administered 2023-07-19 – 2023-07-20 (×4): 0.5 mg via INTRAVENOUS

## 2023-07-18 MED ORDER — OXYCODONE HCL 5 MG PO TABS
5 | ORAL | Status: DC | PRN
Start: 2023-07-18 — End: 2023-07-20
  Administered 2023-07-18 – 2023-07-20 (×8): 10 mg via ORAL

## 2023-07-18 MED ORDER — POTASSIUM BICARB-CITRIC ACID 20 MEQ PO TBEF
20 MEQ | ORAL | Status: AC | PRN
Start: 2023-07-18 — End: 2023-07-20

## 2023-07-18 MED ORDER — OXYCODONE HCL 5 MG PO TABS
5 | ORAL | Status: DC | PRN
Start: 2023-07-18 — End: 2023-07-20
  Administered 2023-07-20: 12:00:00 5 mg via ORAL

## 2023-07-18 MED ORDER — MAGNESIUM SULFATE 2 GM/50ML IV SOLN
250 GM/50ML | INTRAVENOUS | Status: AC | PRN
Start: 2023-07-18 — End: 2023-07-20

## 2023-07-18 MED FILL — PHENYLEPHRINE 250 MCG/ML 10 ML INJECTION (FOR PRIAPISM): INTRAMUSCULAR | Qty: 1

## 2023-07-18 MED FILL — OXYCODONE HCL 5 MG PO TABS: 5 MG | ORAL | Qty: 2

## 2023-07-18 MED FILL — FENTANYL CITRATE (PF) 100 MCG/2ML IJ SOLN: 100 MCG/2ML | INTRAMUSCULAR | Qty: 2

## 2023-07-18 MED FILL — ONDANSETRON HCL 4 MG/2ML IJ SOLN: 4 MG/2ML | INTRAMUSCULAR | Qty: 2

## 2023-07-18 NOTE — ED Provider Notes (Signed)
[facility] EMERGENCY DEPT  EMERGENCY DEPARTMENT ENCOUNTER      Pt Name: Garrett Manning  MRN: [id number]  Birthdate 12-Apr-1966  Date of evaluation: 07/18/2023  Provider: Sebastian Ache, MD  5:20 PM    CHIEF COMPLAINT       Chief Complaint   Patient presents with    Erectile Dysfunction     Pt ambulates into triage. Pt sent By Dr. Tanna Savoy from Urology for c/o priapism. Pt reports tripping several days ago and being struck with piece of wood in groin causing condition. Endorses increased bladder pressure and dysuria. Still able to void. Denies fevers/chills, N/V/D.         HISTORY OF PRESENT ILLNESS    Garrett Manning is a 57 y.o. male who presents to the emergency department      57 year old male with no relevant history presents with painful erection for almost 2 days now.  Patient states on Tuesday he fell into some water and was struck in the base of the penis by the scrotum.  He states that he had significant pain at that time.  He is not since that time he has had erection that will not go away.  He has been trying to ice it.  States he has not been able to get it to go away.  He was able to see his urologist today.  Attempted to drain the penis without any success.  He states his urologist told him to come to the emergency department where he would attempt to inject the medication potentially take him to the operating room for procedure.  Notes moderate to severe pain.          Nursing Notes were reviewed.    REVIEW OF SYSTEMS       Review of Systems   Constitutional:  Negative for chills and fever.   HENT:  Negative for congestion and rhinorrhea.    Eyes:  Negative for itching.   Respiratory:  Negative for cough and shortness of breath.    Cardiovascular:  Negative for chest pain, palpitations and leg swelling.   Gastrointestinal:  Negative for abdominal distention, abdominal pain, diarrhea, nausea and vomiting.   Genitourinary:  Positive for penile pain and penile swelling. Negative for dysuria.   Musculoskeletal:   Negative for back pain and myalgias.   Skin:  Negative for rash.   Neurological:  Negative for dizziness and weakness.   Psychiatric/Behavioral:  Negative for confusion.    All other systems reviewed and are negative.      Except as noted above the remainder of the review of systems was reviewed and negative.       PAST MEDICAL HISTORY     Past Medical History:   Diagnosis Date    Back pain     Diabetes (HCC)     Enlarged prostate          SURGICAL HISTORY       Past Surgical History:   Procedure Laterality Date    FOOT SURGERY Bilateral     KNEE ARTHROSCOPY Right     TONSILLECTOMY      TURP N/A 05/17/2022    CYSTOSCOPY TRANSURETHRAL RESECTION PROSTATE performed by Clarisse Gouge, MD at [facility] MAIN OR         CURRENT MEDICATIONS       Previous Medications    BACLOFEN (LIORESAL) 10 MG TABLET    Take 1 tablet by mouth 3 times daily    CELECOXIB (CELEBREX) 200 MG CAPSULE  Take 1 capsule by mouth daily    FINASTERIDE (PROSCAR) 5 MG TABLET    Take 1 tablet by mouth daily    IBUPROFEN (ADVIL;MOTRIN) 600 MG TABLET    Take 1 tablet by mouth every 6 hours as needed for Pain    METFORMIN (GLUCOPHAGE-XR) 750 MG EXTENDED RELEASE TABLET    Take 1 tablet by mouth daily (with breakfast)    SEMAGLUTIDE, 1 MG/DOSE, (OZEMPIC, 1 MG/DOSE,) 2 MG/1.5ML SOPN    Inject into the skin    TAMSULOSIN (FLOMAX) 0.4 MG CAPSULE    Take 1 capsule by mouth in the morning and at bedtime       ALLERGIES     Patient has no known allergies.    FAMILY HISTORY     No family history on file.       SOCIAL HISTORY       Social History     Socioeconomic History    Marital status: Married   Tobacco Use    Smoking status: Every Day     Current packs/day: 1.00     Types: Cigarettes   Vaping Use    Vaping status: Never Used   Substance and Sexual Activity    Alcohol use: Never    Drug use: Never       SCREENINGS         Glasgow Coma Scale  Eye Opening: Spontaneous  Best Verbal Response: Oriented  Best Motor Response: Obeys commands  Glasgow Coma Scale  Score: 15                     CIWA Assessment  BP: 137/82  Pulse: (!) 103                 PHYSICAL EXAM       ED Triage Vitals [07/18/23 1542]   BP Systolic BP Percentile Diastolic BP Percentile Temp Temp Source Pulse Respirations SpO2   (!) 159/93 -- -- 97.5 F (36.4 C) Oral (!) 103 18 94 %      Height Weight - Scale         1.854 m (6\' 1" ) 114.8 kg (253 lb)             Physical Exam  Vitals and nursing note reviewed.   Constitutional:       Appearance: Normal appearance.   HENT:      Head: Normocephalic and atraumatic.      Right Ear: External ear normal.      Left Ear: External ear normal.      Nose: Nose normal.   Eyes:      Extraocular Movements: Extraocular movements intact.      Conjunctiva/sclera: Conjunctivae normal.   Cardiovascular:      Rate and Rhythm: Normal rate and regular rhythm.      Pulses: Normal pulses.   Pulmonary:      Effort: Pulmonary effort is normal. No respiratory distress.   Abdominal:      General: Abdomen is flat. Bowel sounds are normal.   Genitourinary:     Comments: Persistent erection noted  Musculoskeletal:         General: Normal range of motion.      Cervical back: Normal range of motion and neck supple.   Skin:     General: Skin is warm.      Capillary Refill: Capillary refill takes less than 2 seconds.   Neurological:      General: No focal deficit present.  Mental Status: He is alert and oriented to person, place, and time. Mental status is at baseline.      Sensory: No sensory deficit.         DIAGNOSTIC RESULTS     EKG: All EKG's are interpreted by the Emergency Department Physician who either signs or Co-signs this chart in the absence of a cardiologist.        RADIOLOGY:   Non-plain film images such as CT, Ultrasound and MRI are read by the radiologist. Plain radiographic images are visualized and preliminarily interpreted by the emergency physician with the below findings:        Interpretation per the Radiologist below, if available at the time of this note:    No  orders to display         ED BEDSIDE ULTRASOUND:   Performed by ED Physician - none    LABS:  Labs Reviewed   CBC WITH AUTO DIFFERENTIAL - Abnormal; Notable for the following components:       Result Value    WBC 17.0 (*)     Neutrophils % 82.1 (*)     Lymphocytes 7.4 (*)     Neutrophils Absolute 14.0 (*)     Monocytes Absolute 1.5 (*)     Immature Grans (Abs) 0.10 (*)     All other components within normal limits   BASIC METABOLIC PANEL - Abnormal; Notable for the following components:    Glucose 108 (*)     All other components within normal limits   BASIC METABOLIC PANEL W/ REFLEX TO MG FOR LOW K   CBC WITH AUTO DIFFERENTIAL       All other labs were within normal range or not returned as of this dictation.    EMERGENCY DEPARTMENT COURSE and DIFFERENTIAL DIAGNOSIS/MDM:   Vitals:    Vitals:    07/18/23 1542 07/18/23 1600   BP: (!) 159/93 137/82   Pulse: (!) 103    Resp: 18    Temp: 97.5 F (36.4 C)    TempSrc: Oral    SpO2: 94% 95%   Weight: 114.8 kg (253 lb)    Height: 1.854 m (6\' 1" )            Medical Decision Making  57 year old male presents with painful erection for the past 2 days.  States that it started status post trauma to the area.  Otherwise patient noted to have persistent erection, consistent with priapism.  Had already been attempted to be drained by his urologist in outpatient setting earlier today.  Sent here for further evaluation and management.  Otherwise patient is urologist was and reviewed at the time.  We did not attempt any actual procedures or cells.  He did attempt phenylephrine injection placed admit the patient for surgical management.  Pain controlled with fentanyl    Amount and/or Complexity of Data Reviewed  Labs: ordered.     Details: CBC CMP unremarkable,    Risk  Prescription drug management.  Decision regarding hospitalization.            REASSESSMENT          CRITICAL CARE TIME       CONSULTS:  None    PROCEDURES:  Unless otherwise noted below, none      Procedures        FINAL IMPRESSION      1. Priapism          DISPOSITION/PLAN   DISPOSITION Admitted 07/18/2023 05:10:57 PM  Condition  at Disposition: Data Unavailable      PATIENT REFERRED TO:  No follow-up provider specified.    DISCHARGE MEDICATIONS:  New Prescriptions    No medications on file     Controlled Substances Monitoring:          No data to display                (Please note that portions of this note were completed with a voice recognition program.  Efforts were made to edit the dictations but occasionally words are mis-transcribed.)    Sebastian Ache, MD (electronically signed)  Attending Emergency Physician            Sebastian Ache, MD  07/18/23 [phone]

## 2023-07-18 NOTE — H&P (Signed)
Urology Attending Admission Note      Reason for Admission: Priapism    History: The patient presented to the office with priapism over 48 hours which she related to some scrotal trauma while working on his new house he was having moderate pain in the office we tried to decompress and aspirate his corpora he had significant fibrosis and I was able to get the erection down 3 times with fairly rapid return we sent him to the emergency room where I can aspirated and decompressed the erection and injected phenylephrine admit him overnight if still unsuccessful in the morning will consider operative shunting    Meds: see med rec  Family History, Social History, Review of Systems:  Reviewed and agreed to as per chart    Exam:    Vitals:  BP 137/82   Pulse (!) 103   Temp 97.5 F (36.4 C) (Oral)   Resp 18   Ht 1.854 m (6\' 1" )   Wt 114.8 kg (253 lb)   SpO2 95%   BMI 33.38 kg/m   Temp  Avg: 97.5 F (36.4 C)  Min: 97.5 F (36.4 C)  Max: 97.5 F (36.4 C)  No intake or output data in the 24 hours ending 07/18/23 1711    Physical:   Well developed, well nourished in no acute distress  Mood indicates no abnormalities. Pt doesn't appear depressed  Orientated to time and place  Neck is supple, trachea is midline  Respiratory effort is normal  Cardiovascular show no extremity swelling  Abdomen no masses or hernias are palpated, there is no tenderness. Liver and Spleen appear normal.  Skin show no abnormal lesions  Lymph nodes are not palpated in the inguinal, neck, or axillary area.     Male GU:  Penis appears normal and circumcised  Urethral meatus is normal in size and location  Scrotum appears normal and both testicles appear normal in size and location  Sphincter has good tone  Anus is inspected. There are no perineal masses  Prostate is not examined, no tenderness and no induration  Seminal Vesicles are not palpable      Labs:  WBC:    Lab Results   Component Value Date/Time    WBC 17.0 07/18/2023 04:05 PM      Hemoglobin/Hematocrit:    Lab Results   Component Value Date/Time    HGB 14.1 07/18/2023 04:05 PM    HCT 42.3 07/18/2023 04:05 PM     BMP:    Lab Results   Component Value Date/Time    NA 136 07/18/2023 04:05 PM    K 3.5 07/18/2023 04:05 PM    CL 101 07/18/2023 04:05 PM    CO2 22 07/18/2023 04:05 PM    BUN 13 07/18/2023 04:05 PM    CREATININE 0.9 07/18/2023 04:05 PM    CALCIUM 9.0 07/18/2023 04:05 PM    LABGLOM 99 07/18/2023 04:05 PM    LABGLOM 103 05/19/2022 06:42 AM     PT/INR:  No results found for: "PROTIME", "INR"  PTT:  No results found for: "APTT"[APTT    Urinalysis: Negative    Imaging: None    Impression/Plan: Priapism over 48 hours unsuccessful irrigation in the office have repeated irrigation with phenylephrine in the ER if unsuccessful will plan shunting tomorrow in the OR    Clarisse Gouge, MD

## 2023-07-19 LAB — CBC WITH AUTO DIFFERENTIAL
Hematocrit: 40.2 % (ref 38.0–52.0)
Hemoglobin: 12.7 g/dL — ABNORMAL LOW (ref 13.0–17.3)
MCH: 28.9 pg (ref 27.0–34.5)
MCHC: 31.6 g/dL (ref 30.0–36.0)
MCV: 91.6 fL (ref 84.0–100.0)
MPV: 10.2 fL (ref 7.0–12.2)
NRBC Absolute: 0 10*3/uL (ref 0.000–0.012)
NRBC Automated: 0 % (ref 0.0–0.2)
Platelets: 217 10*3/uL (ref 140–440)
RBC: 4.39 x10e6/mcL (ref 4.00–5.60)
RDW: 15.1 % (ref 10.0–17.0)
WBC: 15.4 10*3/uL — ABNORMAL HIGH (ref 3.8–10.6)

## 2023-07-19 LAB — BASIC METABOLIC PANEL W/ REFLEX TO MG FOR LOW K
Anion Gap: 8 mmol/L (ref 2–17)
BUN: 13 mg/dL (ref 6–20)
CO2: 27 mmol/L (ref 22–29)
Calcium: 8.5 mg/dL (ref 8.5–10.7)
Chloride: 102 mmol/L (ref 98–107)
Creatinine: 0.9 mg/dL (ref 0.7–1.3)
Est, Glom Filt Rate: 99 mL/min/1.73mÂ² (ref >=60)
Glucose: 105 mg/dL — ABNORMAL HIGH (ref 70–99)
Osmolaliy Calculated: 274 mosm/kg (ref 270–287)
Potassium: 3.9 mmol/L (ref 3.5–5.3)
Sodium: 137 mmol/L (ref 135–145)

## 2023-07-19 LAB — MANUAL DIFFERENTIAL
Eosinophils %: 1 % (ref 0–7)
Eosinophils Absolute: 0.2 10*3/uL (ref 0.0–0.5)
Lymphocytes Absolute: 2 10*3/uL (ref 1.0–3.2)
Lymphocytes: 13 % — ABNORMAL LOW (ref 15–45)
Monocytes %: 9 % (ref 4–12)
Monocytes Absolute: 1.4 10*3/uL — ABNORMAL HIGH (ref 0.3–1.0)
Neutrophils %. Manual count: 77 % — ABNORMAL HIGH (ref 42–74)
Neutrophils Absolute: 11.9 10*3/uL — ABNORMAL HIGH (ref 1.6–7.3)
Platelet Estimate: ADEQUATE
RBC Morphology: NORMAL

## 2023-07-19 MED ORDER — ALBUTEROL SULFATE HFA 108 (90 BASE) MCG/ACT IN AERS
108 | RESPIRATORY_TRACT | Status: DC | PRN
  Administered 2023-07-19 (×2): 4 via RESPIRATORY_TRACT

## 2023-07-19 MED ORDER — PHENYLEPHRINE HCL 1 MG/10ML IV SOSY
1 | INTRAVENOUS | Status: DC | PRN
  Administered 2023-07-19: 16:00:00 100 via INTRAVENOUS
  Administered 2023-07-19: 16:00:00 200 via INTRAVENOUS

## 2023-07-19 MED ORDER — ROCURONIUM BROMIDE 50 MG/5ML IV SOLN
50 | INTRAVENOUS | Status: DC | PRN
  Administered 2023-07-19: 16:00:00 10 via INTRAVENOUS

## 2023-07-19 MED ORDER — HYDRALAZINE HCL 20 MG/ML IJ SOLN
20 | INTRAMUSCULAR | Status: DC | PRN
Start: 2023-07-19 — End: 2023-07-19

## 2023-07-19 MED ORDER — CEFAZOLIN SODIUM 1 G IJ SOLR
1 | INTRAMUSCULAR | Status: AC
Start: 2023-07-19 — End: ?

## 2023-07-19 MED ORDER — DEXAMETHASONE SODIUM PHOSPHATE 4 MG/ML IJ SOLN
4 | INTRAMUSCULAR | Status: AC
Start: 2023-07-19 — End: ?

## 2023-07-19 MED ORDER — MEPERIDINE HCL 25 MG/ML IJ SOLN
25 | INTRAMUSCULAR | Status: DC | PRN
Start: 2023-07-19 — End: 2023-07-19

## 2023-07-19 MED ORDER — SODIUM CHLORIDE 0.9 % IV SOLN
0.9 | INTRAVENOUS | Status: DC | PRN
Start: 2023-07-19 — End: 2023-07-19

## 2023-07-19 MED ORDER — FINASTERIDE 5 MG PO TABS
5 | Freq: Every day | ORAL | Status: DC
Start: 2023-07-19 — End: 2023-07-20
  Administered 2023-07-19 – 2023-07-20 (×2): 5 mg via ORAL

## 2023-07-19 MED ORDER — ONDANSETRON HCL 4 MG/2ML IJ SOLN
4 | INTRAMUSCULAR | Status: AC
Start: 2023-07-19 — End: ?

## 2023-07-19 MED ORDER — LACTATED RINGERS IV SOLN
INTRAVENOUS | Status: DC | PRN
  Administered 2023-07-19: 16:00:00 via INTRAVENOUS

## 2023-07-19 MED ORDER — ROCURONIUM BROMIDE 50 MG/5ML IV SOLN
50 | INTRAVENOUS | Status: AC
Start: 2023-07-19 — End: ?

## 2023-07-19 MED ORDER — PROPOFOL 200 MG/20ML IV EMUL
200 | INTRAVENOUS | Status: DC | PRN
  Administered 2023-07-19: 16:00:00 200 via INTRAVENOUS

## 2023-07-19 MED ORDER — ONDANSETRON HCL 4 MG/2ML IJ SOLN
4 | Freq: Once | INTRAMUSCULAR | Status: DC | PRN
Start: 2023-07-19 — End: 2023-07-19

## 2023-07-19 MED ORDER — PROPOFOL 200 MG/20ML IV EMUL
200 | INTRAVENOUS | Status: AC
Start: 2023-07-19 — End: ?

## 2023-07-19 MED ORDER — HYDROMORPHONE HCL 1 MG/ML IJ SOLN
1 | INTRAMUSCULAR | Status: DC | PRN
Start: 2023-07-19 — End: 2023-07-19

## 2023-07-19 MED ORDER — LORAZEPAM 2 MG/ML IJ SOLN
2 | Freq: Once | INTRAMUSCULAR | Status: DC | PRN
Start: 2023-07-19 — End: 2023-07-19

## 2023-07-19 MED ORDER — DEXAMETHASONE SODIUM PHOSPHATE 4 MG/ML IJ SOLN
4 | INTRAMUSCULAR | Status: DC | PRN
  Administered 2023-07-19: 16:00:00 4 via INTRAVENOUS

## 2023-07-19 MED ORDER — DIPHENHYDRAMINE HCL 50 MG/ML IJ SOLN
50 | Freq: Once | INTRAMUSCULAR | Status: DC | PRN
Start: 2023-07-19 — End: 2023-07-19

## 2023-07-19 MED ORDER — IPRATROPIUM-ALBUTEROL 0.5-2.5 (3) MG/3ML IN SOLN
Freq: Once | RESPIRATORY_TRACT | Status: AC | PRN
Start: 2023-07-19 — End: 2023-07-19
  Administered 2023-07-19: 17:00:00 1 via RESPIRATORY_TRACT

## 2023-07-19 MED ORDER — NALOXONE HCL 0.4 MG/ML IJ SOLN
0.4 MG/ML | INTRAMUSCULAR | Status: DC | PRN
Start: 2023-07-19 — End: 2023-07-19

## 2023-07-19 MED ORDER — SUCCINYLCHOLINE CHLORIDE 20 MG/ML IJ SOLN
20 | INTRAMUSCULAR | Status: DC | PRN
  Administered 2023-07-19: 16:00:00 160 via INTRAVENOUS

## 2023-07-19 MED ORDER — LIDOCAINE HCL (PF) 2 % IJ SOLN
2 | INTRAMUSCULAR | Status: DC | PRN
  Administered 2023-07-19: 16:00:00 80 via INTRAVENOUS

## 2023-07-19 MED ORDER — BUPIVACAINE HCL (PF) 0.5 % IJ SOLN
0.5 | INTRAMUSCULAR | Status: DC | PRN
Start: 2023-07-19 — End: 2023-07-19
  Administered 2023-07-19: 16:00:00 10 via INTRAMUSCULAR

## 2023-07-19 MED ORDER — PHENYLEPHRINE HCL 1 MG/10ML IV SOSY
1 | INTRAVENOUS | Status: AC
Start: 2023-07-19 — End: ?

## 2023-07-19 MED ORDER — TAMSULOSIN HCL 0.4 MG PO CAPS
0.4 | Freq: Two times a day (BID) | ORAL | Status: DC
Start: 2023-07-19 — End: 2023-07-20
  Administered 2023-07-20 (×2): 0.4 mg via ORAL

## 2023-07-19 MED ORDER — ALBUTEROL SULFATE HFA 108 (90 BASE) MCG/ACT IN AERS
108 | RESPIRATORY_TRACT | Status: AC
Start: 2023-07-19 — End: ?

## 2023-07-19 MED ORDER — FENTANYL CITRATE (PF) 100 MCG/2ML IJ SOLN
100 | INTRAMUSCULAR | Status: DC | PRN
  Administered 2023-07-19: 16:00:00 50 via INTRAVENOUS

## 2023-07-19 MED ORDER — FENTANYL CITRATE (PF) 100 MCG/2ML IJ SOLN
100 | INTRAMUSCULAR | Status: AC
Start: 2023-07-19 — End: ?

## 2023-07-19 MED ORDER — ACETAMINOPHEN 325 MG PO TABS
325 | Freq: Once | ORAL | Status: DC | PRN
Start: 2023-07-19 — End: 2023-07-19

## 2023-07-19 MED ORDER — SUCCINYLCHOLINE CHLORIDE 20 MG/ML IJ SOLN
20 | INTRAMUSCULAR | Status: AC
Start: 2023-07-19 — End: ?

## 2023-07-19 MED ORDER — MIDAZOLAM HCL 2 MG/2ML IJ SOLN
2 | INTRAMUSCULAR | Status: DC | PRN
  Administered 2023-07-19: 16:00:00 2 via INTRAVENOUS

## 2023-07-19 MED ORDER — LIDOCAINE HCL (PF) 2 % IJ SOLN
2 | INTRAMUSCULAR | Status: AC
Start: 2023-07-19 — End: ?

## 2023-07-19 MED ORDER — CEFAZOLIN SODIUM 1 G IJ SOLR
1 | INTRAMUSCULAR | Status: DC | PRN
  Administered 2023-07-19: 16:00:00 2 via INTRAVENOUS

## 2023-07-19 MED ORDER — ONDANSETRON HCL 4 MG/2ML IJ SOLN
4 | INTRAMUSCULAR | Status: DC | PRN
  Administered 2023-07-19: 16:00:00 4 via INTRAVENOUS

## 2023-07-19 MED ORDER — METFORMIN HCL ER 750 MG PO TB24
750 | Freq: Every day | ORAL | Status: DC
Start: 2023-07-19 — End: 2023-07-20
  Administered 2023-07-20: 12:00:00 750 mg via ORAL

## 2023-07-19 MED ORDER — DROPERIDOL 2.5 MG/ML IJ SOLN
2.5 | Freq: Once | INTRAMUSCULAR | Status: DC | PRN
Start: 2023-07-19 — End: 2023-07-19

## 2023-07-19 MED ORDER — LABETALOL HCL 5 MG/ML IV SOLN
5 | INTRAVENOUS | Status: DC | PRN
Start: 2023-07-19 — End: 2023-07-19

## 2023-07-19 MED ORDER — MIDAZOLAM HCL 2 MG/2ML IJ SOLN
2 | INTRAMUSCULAR | Status: AC
Start: 2023-07-19 — End: ?

## 2023-07-19 MED ORDER — FENTANYL CITRATE (PF) 100 MCG/2ML IJ SOLN
100 | INTRAMUSCULAR | Status: DC | PRN
Start: 2023-07-19 — End: 2023-07-19

## 2023-07-19 MED FILL — XYLOCAINE-MPF 2 % IJ SOLN: 2 % | INTRAMUSCULAR | Qty: 5

## 2023-07-19 MED FILL — OXYCODONE HCL 5 MG PO TABS: 5 MG | ORAL | Qty: 2

## 2023-07-19 MED FILL — PHENYLEPHRINE HCL (PRESSORS) 1 MG/10ML IV SOSY: 1 MG/0ML | INTRAVENOUS | Qty: 10

## 2023-07-19 MED FILL — FINASTERIDE 5 MG PO TABS: 5 MG | ORAL | Qty: 1

## 2023-07-19 MED FILL — ROCURONIUM BROMIDE 50 MG/5ML IV SOLN: 50 MG/5ML | INTRAVENOUS | Qty: 5

## 2023-07-19 MED FILL — POLYETHYLENE GLYCOL 3350 17 G PO PACK: 17 g | ORAL | Qty: 1

## 2023-07-19 MED FILL — ALBUTEROL SULFATE HFA 108 (90 BASE) MCG/ACT IN AERS: 108 (90 Base) MCG/ACT | RESPIRATORY_TRACT | Qty: 8.5

## 2023-07-19 MED FILL — IPRATROPIUM-ALBUTEROL 0.5-2.5 (3) MG/3ML IN SOLN: RESPIRATORY_TRACT | Qty: 3

## 2023-07-19 MED FILL — ONDANSETRON HCL 4 MG/2ML IJ SOLN: 4 MG/2ML | INTRAMUSCULAR | Qty: 2

## 2023-07-19 MED FILL — MONOJECT FLUSH SYRINGE 0.9 % IV SOLN: 0.9 % | INTRAVENOUS | Qty: 10

## 2023-07-19 MED FILL — DIPRIVAN 200 MG/20ML IV EMUL: 200 MG/20ML | INTRAVENOUS | Qty: 20

## 2023-07-19 MED FILL — ANECTINE 20 MG/ML IJ SOLN: 20 MG/ML | INTRAMUSCULAR | Qty: 10

## 2023-07-19 MED FILL — MIDAZOLAM HCL 2 MG/2ML IJ SOLN: 2 MG/ML | INTRAMUSCULAR | Qty: 2

## 2023-07-19 MED FILL — HYDROMORPHONE HCL 2 MG/ML IJ SOLN: 2 MG/ML | INTRAMUSCULAR | Qty: 1

## 2023-07-19 MED FILL — CEFAZOLIN SODIUM 1 G IJ SOLR: 1 g | INTRAMUSCULAR | Qty: 2000

## 2023-07-19 MED FILL — FENTANYL CITRATE (PF) 100 MCG/2ML IJ SOLN: 100 MCG/2ML | INTRAMUSCULAR | Qty: 2

## 2023-07-19 MED FILL — DEXAMETHASONE SODIUM PHOSPHATE 4 MG/ML IJ SOLN: 4 MG/ML | INTRAMUSCULAR | Qty: 1

## 2023-07-19 NOTE — Anesthesia Procedure Notes (Signed)
Airway  Date/Time: 07/19/2023 11:42 AM  Urgency: elective    Airway not difficult    General Information and Staff    Patient location during procedure: OR  Performed: resident/CRNA/CAA   Performed by: Sondra Barges, APRN - CRNA  Authorized by: De Burrs, MD      Indications and Patient Condition  Indications for airway management: anesthesia and airway protection  Spontaneous Ventilation: absent  Sedation level: deep  Preoxygenated: yes  Patient position: sniffing  MILS maintained throughout  Mask difficulty assessment: not attempted    Final Airway Details  Final airway type: endotracheal airway      Successful airway: ETT  Cuffed: yes   Successful intubation technique: direct laryngoscopy  Facilitating devices/methods: intubating stylet  Endotracheal tube insertion site: oral  Blade: Macintosh  Blade size: #3  ETT size (mm): 7.5  Cormack-Lehane Classification: grade I - full view of glottis  Placement verified by: chest auscultation and capnometry   Inital cuff pressure (cm H2O): 5  Measured from: lips  ETT to lips (cm): 22  Number of attempts at approach: 1  Number of other approaches attempted: 0    Additional Comments  RSI, Smooth IV induction, lips and dentition unchanged from preop, +ETCO2, +BBS

## 2023-07-19 NOTE — Op Note (Signed)
Operative Note      Patient: Garrett Manning  Date of Birth: 03-20-1966  MRN: [id number]    Date of Procedure: 17-Aug-2023    Pre-Op Diagnosis Codes:      * Priapism [N48.30]    Post-Op Diagnosis: Same       Procedure penile corporal spongiosum to corporal cavernosal shunt    Surgeon(s):  Quyen Cutsforth, Stefanie Libel, MD    Assistant:   * No surgical staff found *    Anesthesia: General    Estimated Blood Loss (mL): less than 100     Complications: None    Specimens:   * No specimens in log *    Implants:  * No implants in log *      Drains:   Urinary Catheter 2023/08/17 Coude;2 Way (Active)       Findings:  Infection Present At Time Of Surgery (PATOS) (choose all levels that have infection present):  No infection present  Other Findings: Extensive fibrosis of the corpora    Detailed Description of Procedure:   Patient brought operating room general anesthesia was established with endotracheal anesthesia he was given preoperative antibiotics in the interim timeout was done penis during her sterilely prepped and draped penile block was placed with some Marcaine around the base of the penis and under the pubic notch 18 Foley catheter was placed and the bladder was drained the corpora tips could be easily palpated in the glans and small incision was made over them and I used an 11 blade to open the distal tip and a cross type incision I then inserted the Metzenbaums and advanced him back towards the proximal corpora opening the Metzenbaums to try to get some drainage of the corpora .  A similar procedure was done on the other side we then expressed all the old blood out of the corpora the corpora was left open and a running interlocking stitch was placed on the glans attempting to get it hemostatic there was still some leakage with compression of the corpora the corpora did not appear to reinflate a sterile dressing was applied the patient was awakened taken recovery room in stable condition    Electronically signed by Clarisse Gouge, MD on 08-17-23 at 12:14 PM

## 2023-07-19 NOTE — Anesthesia Pre-Procedure Evaluation (Signed)
Department of Anesthesiology  Preprocedure Note       Name:  Garrett Manning   Age:  57 y.o.  DOB:  12-28-1965                                          MRN:  [id number]         Date:  07/19/2023      Surgeon: Moishe Spice):  Ramsay, Stefanie Libel, MD    Procedure: Procedure(s):  PENIS FRACTURE REPAIR    Medications prior to admission:   Prior to Admission medications    Medication Sig Start Date End Date Taking? Authorizing Provider   metFORMIN (GLUCOPHAGE-XR) 750 MG extended release tablet Take 1 tablet by mouth daily (with breakfast)   Yes [provider]   Semaglutide, 1 MG/DOSE, (OZEMPIC, 1 MG/DOSE,) 2 MG/1.5ML SOPN Inject into the skin   Yes [provider]   tamsulosin (FLOMAX) 0.4 MG capsule Take 1 capsule by mouth in the morning and at bedtime   Yes [provider]   finasteride (PROSCAR) 5 MG tablet Take 1 tablet by mouth daily   Yes [provider]   celecoxib (CELEBREX) 200 MG capsule Take 1 capsule by mouth daily  Patient not taking: Reported on 07/19/2023    [provider]   ibuprofen (ADVIL;MOTRIN) 600 MG tablet Take 1 tablet by mouth every 6 hours as needed for Pain    [provider]   baclofen (LIORESAL) 10 MG tablet Take 1 tablet by mouth 3 times daily  Patient not taking: Reported on 07/19/2023    [provider]       Current medications:    Current Facility-Administered Medications   Medication Dose Route Frequency Provider Last Rate Last Admin   . phenylephrine (NEO-SYNEPHRINE) 250 mcg/mL injection (FOR PRIAPISM)  250 mcg IntraCAVernosal Q5 Min PRN Ramsay, Stefanie Libel, MD       . 0.9 % sodium chloride infusion   IntraVENous Continuous Ramsay, Stefanie Libel, MD 100 mL/hr at 07/19/23 0307 New Bag at 07/19/23 0307   . sodium chloride flush 0.9 % injection 5-40 mL  5-40 mL IntraVENous 2 times per day Ramsay, Stefanie Libel, MD   10 mL at 07/18/23 2249   . sodium chloride flush 0.9 % injection 5-40 mL  5-40 mL IntraVENous PRN  Ramsay, Stefanie Libel, MD       . 0.9 % sodium chloride infusion   IntraVENous PRN Ramsay, Stefanie Libel, MD       . potassium chloride (KLOR-CON M) extended release tablet 40 mEq  40 mEq Oral PRN Ramsay, Stefanie Libel, MD        Or   . potassium bicarb-citric acid (EFFER-K) effervescent tablet 40 mEq  40 mEq Oral PRN Ramsay, Stefanie Libel, MD        Or   . potassium chloride 10 mEq/100 mL IVPB (Peripheral Line)  10 mEq IntraVENous PRN Ramsay, Stefanie Libel, MD       . magnesium sulfate 2000 mg in water 50 mL IVPB  2,000 mg IntraVENous PRN Ramsay, Stefanie Libel, MD       . polyethylene glycol (GLYCOLAX) packet 17 g  17 g Oral Daily PRN Ramsay, Stefanie Libel, MD       . ondansetron (ZOFRAN-ODT) disintegrating tablet 4 mg  4 mg Oral Q8H PRN Ramsay, Stefanie Libel, MD        Or   .  ondansetron (ZOFRAN) injection 4 mg  4 mg IntraVENous Q6H PRN Ramsay, Stefanie Libel, MD       . HYDROmorphone HCl PF (DILAUDID) injection 0.5 mg  0.5 mg IntraVENous Q3H PRN Ramsay, Stefanie Libel, MD       . oxyCODONE (ROXICODONE) immediate release tablet 5 mg  5 mg Oral Q4H PRN Ramsay, Stefanie Libel, MD        Or   . oxyCODONE (ROXICODONE) immediate release tablet 10 mg  10 mg Oral Q4H PRN Ramsay, Stefanie Libel, MD   10 mg at 07/19/23 0740       Allergies:  No Known Allergies    Problem List:    Patient Active Problem List   Diagnosis Code   . S/P transurethral resection of prostate Z90.79   . Priapism N48.30       Past Medical History:        Diagnosis Date   . Back pain    . Diabetes (HCC)    . Enlarged prostate        Past Surgical History:        Procedure Laterality Date   . FOOT SURGERY Bilateral    . KNEE ARTHROSCOPY Right    . TONSILLECTOMY     . TURP N/A 05/17/2022    CYSTOSCOPY TRANSURETHRAL RESECTION PROSTATE performed by Clarisse Gouge, MD at [facility] MAIN OR       Social History:    Social History     Tobacco Use   . Smoking status: Every Day     Current packs/day: 1.00      Types: Cigarettes   . Smokeless tobacco: Not on file   Substance Use Topics   . Alcohol use: Never                                Ready to quit: Not Answered  Counseling given: Not Answered      Vital Signs (Current):   Vitals:    07/19/23 0019 07/19/23 0340 07/19/23 0443 07/19/23 0806   BP: 130/80  (!) 118/93 123/83   Pulse: 80  98 92   Resp: 17 16 17 16    Temp: 98.5 F (36.9 C)  98.3 F (36.8 C) 98.8 F (37.1 C)   TempSrc: Oral  Oral Oral   SpO2: 94%  95% 95%   Weight:       Height:                                                  BP Readings from Last 3 Encounters:   07/19/23 123/83   05/19/22 120/87   05/28/20 138/88       NPO Status:                                                                                 BMI:   Wt Readings from Last 3 Encounters:   07/18/23 114.8 kg (253 lb)   05/19/22 116.1 kg (256 lb)  05/28/20 131.5 kg (290 lb)     Body mass index is 33.38 kg/m.    CBC:   Lab Results   Component Value Date/Time    WBC 15.4 07/19/2023 06:43 AM    RBC 4.39 07/19/2023 06:43 AM    HGB 12.7 07/19/2023 06:43 AM    HCT 40.2 07/19/2023 06:43 AM    MCV 91.6 07/19/2023 06:43 AM    RDW 15.1 07/19/2023 06:43 AM    PLT 217 07/19/2023 06:43 AM       CMP:   Lab Results   Component Value Date/Time    NA 137 07/19/2023 06:43 AM    K 3.9 07/19/2023 06:43 AM    CL 102 07/19/2023 06:43 AM    CO2 27 07/19/2023 06:43 AM    BUN 13 07/19/2023 06:43 AM    CREATININE 0.9 07/19/2023 06:43 AM    LABGLOM 99 07/19/2023 06:43 AM    LABGLOM 103 05/19/2022 06:42 AM    GLUCOSE 105 07/19/2023 06:43 AM    CALCIUM 8.5 07/19/2023 06:43 AM       POC Tests:   Recent Labs     07/18/23  1811   POCGLU 110.0       Coags: No results found for: "PROTIME", "INR", "APTT"    HCG (If Applicable): No results found for: "PREGTESTUR", "PREGSERUM", "HCG", "HCGQUANT"     ABGs: No results found for: "PHART", "PO2ART", "PCO2ART", "HCO3ART", "BEART", "O2SATART"     Type & Screen (If Applicable):  No results found for: "LABABO"    Drug/Infectious Status  (If Applicable):  No results found for: "HIV", "HEPCAB"    COVID-19 Screening (If Applicable): No results found for: "COVID19"        Anesthesia Evaluation  Patient summary reviewed and Nursing notes reviewed  Airway: Mallampati: II  TM distance: >3 FB        Dental: normal exam         Pulmonary:normal exam                               Cardiovascular:            Rhythm: regular  Rate: normal                    Neuro/Psych:               GI/Hepatic/Renal:   (+) renal disease (enlarged prostrate s/p turp):         ROS comment: On semiglutide    Priapism  from trauma unsuccessful attempts to eliminate needs shunting.   Endo/Other:    (+) Diabetes.                 Abdominal: normal exam            Vascular:          Other Findings:       Anesthesia Plan      general     ASA 3     (ETT  GA to help relaxation  on semiglutide had clears yesterday)  Induction: intravenous.      Anesthetic plan and risks discussed with patient.        Attending anesthesiologist reviewed and agrees with Preprocedure content            De Burrs, MD   07/19/2023

## 2023-07-19 NOTE — Plan of Care (Signed)
Problem: Discharge Planning  Goal: Discharge to home or other facility with appropriate resources  07/19/2023 2248 by Thompson Caul, RN  Outcome: Progressing  Flowsheets (Taken 07/19/2023 2000)  Discharge to home or other facility with appropriate resources:   Identify barriers to discharge with patient and caregiver   Arrange for needed discharge resources and transportation as appropriate   Identify discharge learning needs (meds, wound care, etc)   Arrange for interpreters to assist at discharge as needed   Refer to discharge planning if patient needs post-hospital services based on physician order or complex needs related to functional status, cognitive ability or social support system  07/19/2023 1125 by Rory Percy, RN  Outcome: Progressing     Problem: Pain  Goal: Verbalizes/displays adequate comfort level or baseline comfort level  07/19/2023 2248 by Thompson Caul, RN  Outcome: Progressing  Flowsheets (Taken 07/19/2023 2002)  Verbalizes/displays adequate comfort level or baseline comfort level:   Encourage patient to monitor pain and request assistance   Administer analgesics based on type and severity of pain and evaluate response   Consider cultural and social influences on pain and pain management   Notify Licensed Independent Practitioner if interventions unsuccessful or patient reports new pain   Implement non-pharmacological measures as appropriate and evaluate response   Assess pain using appropriate pain scale  07/19/2023 1125 by Rory Percy, RN  Outcome: Progressing     Problem: Chronic Conditions and Co-morbidities  Goal: Patient's chronic conditions and co-morbidity symptoms are monitored and maintained or improved  Outcome: Progressing  Flowsheets (Taken 07/19/2023 2000)  Care Plan - Patient's Chronic Conditions and Co-Morbidity Symptoms are Monitored and Maintained or Improved:   Monitor and assess patient's chronic conditions and comorbid symptoms for stability, deterioration, or  improvement   Collaborate with multidisciplinary team to address chronic and comorbid conditions and prevent exacerbation or deterioration   Update acute care plan with appropriate goals if chronic or comorbid symptoms are exacerbated and prevent overall improvement and discharge

## 2023-07-19 NOTE — Anesthesia Post-Procedure Evaluation (Signed)
Department of Anesthesiology  Postprocedure Note    Patient: Garrett Manning  MRN: [id number]  Birthdate: [birth year]  Date of evaluation: 07/19/2023    Procedure Summary       Date: 07/19/23 Room / Location: [facility] OR 07 / [facility]    Anesthesia Start: 1128 Anesthesia Stop: 1223    Procedure: PENIS FRACTURE REPAIR Diagnosis:       Priapism      (Priapism [N48.30])    Surgeons: Clarisse Gouge, MD Responsible Provider: De Burrs, MD    Anesthesia Type: General ASA Status: 3            Anesthesia Type: General    Aldrete Phase I:      Aldrete Phase II:      Anesthesia Post Evaluation    Patient location during evaluation: PACU  Patient participation: complete - patient participated  Level of consciousness: awake  Pain score: 2  Airway patency: patent  Nausea & Vomiting: no vomiting and no nausea  Cardiovascular status: hemodynamically stable  Respiratory status: acceptable  Hydration status: stable  Comments: Vss see pacu  notes  Pain management: adequate    No notable events documented.

## 2023-07-20 MED FILL — FINASTERIDE 5 MG PO TABS: 5 MG | ORAL | Qty: 1

## 2023-07-20 MED FILL — TAMSULOSIN HCL 0.4 MG PO CAPS: 0.4 MG | ORAL | Qty: 1

## 2023-07-20 MED FILL — MONOJECT FLUSH SYRINGE 0.9 % IV SOLN: 0.9 % | INTRAVENOUS | Qty: 20

## 2023-07-20 MED FILL — OXYCODONE HCL 5 MG PO TABS: 5 MG | ORAL | Qty: 1

## 2023-07-20 MED FILL — MONOJECT FLUSH SYRINGE 0.9 % IV SOLN: 0.9 % | INTRAVENOUS | Qty: 40

## 2023-07-20 MED FILL — HYDROMORPHONE HCL 2 MG/ML IJ SOLN: 2 MG/ML | INTRAMUSCULAR | Qty: 1

## 2023-07-20 MED FILL — METFORMIN HCL ER 750 MG PO TB24: 750 MG | ORAL | Qty: 1

## 2023-07-20 MED FILL — OXYCODONE HCL 5 MG PO TABS: 5 MG | ORAL | Qty: 2

## 2023-07-20 NOTE — Discharge Summary (Signed)
Urology Discharge Summary      Patient Identification  Garrett Manning is a 57 y.o. male.  DOB:  [birth year]  Admit Date:  07/18/2023    Discharge date:   No discharge date for patient encounter.                                   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

## 2023-10-29 NOTE — Telephone Encounter (Signed)
Patient would like to schedule np appt with Dr. Katrinka Blazing for back pain, sx done 05/07/2023 and injections [facility],pt is requesting a referral for Pain Management. Please assist on scheduling.  Thanks

## 2024-01-09 ENCOUNTER — Encounter

## 2024-01-10 NOTE — Progress Notes (Signed)
Pre Procedure Patient Instructions     Procedure Location hospital:[facility]: [address] [place] - Drop off at Outpatient Services to the left of the main hospital entrance and proceed to the gold elevator on your left (past the security desk) to the 2nd floor Surgery Reception desk.   Procedure Date 01-16-24  Arrival Time  0845    Your doctor determines your scheduled start time.  Depending on the facility where your procedure is taking place and the scheduled start time, you may be required to arrive up to 2 hours prior.  This is to allow time for registration, your preop assessment, any day of procedure testing and to meet with your care teams members.  This also allows your procedure to be performed earlier should there be a cancellation that day.  We appreciate your patience as we work to provide you with excellent service.    Medications:  Medication to be taken the morning of surgery with a few sips of water only: NONE        Take METFORMIN the day/night before as per usual.  DO NOT take the morning of procedure.  Take Ozempic (Semaglutide) as prescribed EXCEPT hold the dose prior to your procedure. LAST DOSE 2/2       Stop all supplements, vitamins and herbal remedies one week prior to your procedure, unless your doctor told you to continue taking.  Do not take over the counter pain medications except plain Tylenol or Acetaminophen for seven days prior to your procedure unless your doctor told you otherwise.  If you are taking any diabetes and or weight loss medications (including injectables and shots) not discussed during the Pre Admission Testing call, it is important to call [phone] to discuss important instructions.  If you are taking blood thinners, and have not been told when to hold the dose, call the doctor performing your procedure for instructions on when to stop.        Procedure Preparation    Diet Restrictions:Studies show carbohydrate drinks before  procedures are beneficial.  For this reason, Anesthesia asks that you drink 8 oz of non-red Gatorade (sugar free if diabetic) at 6pm and again before midnight the day before your procedure.  If your surgeon instructs you to drink more than 8oz of Gatorade, it's fine to do so.  Your procedure will be cancelled if you do not follow these instructions. Clear liquids ONLY from 12:00 AM (MIDNIGHT) to 11:59 PM the day before your procedure (water, black coffee with no milk or cream, clear juice and without pulp, jello, soda, Ensure Clear, Gatorade, chicken broth, beef broth and bone broth without noodles) and then NO food or drink including gum or mints after 12:00 AM the day of your procedure even if your doctor told you to drink fluids.   No food or drink including gum or mints -after midnight the day of your procedure.      When taking OZEMPIC your stomach doesn't empty as quickly and can cause you to inhale your stomach contents while under sedation/anesthesia.  Your doctors will discuss these risks with you further on the day of your procedure.  Even when following the fasting and medication instructions you may have symptoms such as nausea, vomiting, and/or bloating on the day of your procedure.  Depending on your symptoms, your day of procedure providers may decide that delaying or cancelling your procedure may be the safest decision for you.    If you have  questions on the day of your procedure call [facility] at [phone].    Skin Preparation:  Wash with Hibiclens or an antibacterial soap (e.g. Dial soap) the night before and morning of procedure., Using a clean washcloth, wash from neck to toes for 3 minutes. Gently clean the area where your surgery will be done thoroughly., and Do not use Hibiclens on or near your face, eyes, ears or head.    Do not put on any deodorants, lotions, powders, or oils on the day of procedure.  Be sure to put on clean, comfortable loose fitting  clothing.    Other Preparation:  Call your doctor right away if you have any fever, cold or flu symptoms      Complete the following tests/lab work, CBC and Hemoglobin A1C,  for your procedure by 2/10 at [facility] [address], [place]; M-F 7:30a-6p; Sat 8a-1p; Their phone number is [phone] if needed. You do not need to fast. Bring your picture ID and insurance card. Inform the receptionist you are there for testing before your surgery. Tell them the ordering provider is [facility]. If you have any issue, call [phone]          Day of Procedure Patient Instruction:  Do not smoke, vape, chew tobacco, drink alcohol or use recreational drugs on the day of your procedure  Remove all jewelry, piercings, and metal accessories  Do not wear artificial nails and only clear nail polish on natural nails. Nails must be trimmed to fingertip length to make sure the oxygen probe fits properly and to avoid injury  Do not wear artificial eye lashes because they can cause dry eyes, eye scratches, eye infections and allergic reactions  Do not use hair extensions with metal clips or hairstyles near the back of your neck, as it can make it difficult to safely manage your breathing during anesthesia  If your hair is tightly braided or styled in a complex way, it may need to be undone for your safety  Do not wear contacts, tampons, make-up, lotions, creams, powders, fragrances or deodorant  Wear loose fit clothing  Do not bring valuables or money  Bring a copy of your Living Will and/or Medical Durable Power of Attorney if you have one  Bring a list of current medications including name and dosage  Bring a picture ID and insurance card     Bring a storage case for eyeglasses, hearing aids, dentures, etc          If you are going home the same day as your procedure, a support person should accompany you to the facility and must transport you home.  If you plan to take public transportation of any sort, your  support person must accompany you home.  You should have someone to stay with you for 24 hours after your procedure with sedation of any kind.         This information was reviewed with you during your Pre-Admission Testing interview and you verbalized understanding. If you have any additional questions please contact [phone].    To pre-register for your procedure please call [phone] Option 2 and then Option 3.    For financial questions regarding your procedure at a [facility] facility, please contact [phone].    For financial questions regarding anesthesia at a [facility] facility, please  contact [phone].    For MyChart Patient Portal help please call [phone].    For  hotel accommodations please visit [email or web address] and select PATIENTS &VISITORS -> "FOR VISITORS" section and then TRAVEL INFORMATION -> PLACES TO STAY

## 2024-01-13 ENCOUNTER — Encounter

## 2024-01-14 LAB — HEMOGLOBIN A1C
Estimated Avg Glucose: 117
Estimated Avg Glucose: 126
Hemoglobin A1C: 5.7 % (ref 4.0–6.0)

## 2024-01-14 LAB — CBC
Hematocrit: 46.6 % (ref 38.0–52.0)
Hemoglobin: 15.5 g/dL (ref 13.0–17.3)
MCH: 29.9 pg (ref 27.0–34.5)
MCHC: 33.3 g/dL (ref 30.0–36.0)
MCV: 89.8 fL (ref 84.0–100.0)
MPV: 10 fL (ref 7.0–12.2)
NRBC Absolute: 0 10*3/uL (ref 0.000–0.012)
NRBC Automated: 0 % (ref 0.0–0.2)
Platelets: 298 10*3/uL (ref 140–440)
RBC: 5.19 x10e6/mcL (ref 4.00–5.60)
RDW: 15.1 % (ref 10.0–17.0)
WBC: 11.6 10*3/uL — ABNORMAL HIGH (ref 3.8–10.6)

## 2024-01-16 ENCOUNTER — Inpatient Hospital Stay
Admit: 2024-01-16 | Discharge: 2024-01-19 | Disposition: A | Discharge status: Home / Self Care | Payer: BLUE CROSS/BLUE SHIELD | Admitting: Urology

## 2024-01-16 DIAGNOSIS — N401 Enlarged prostate with lower urinary tract symptoms: Secondary | ICD-10-CM

## 2024-01-16 DIAGNOSIS — Z01812 Encounter for preprocedural laboratory examination: Secondary | ICD-10-CM

## 2024-01-16 LAB — POCT GLUCOSE
POC Glucose: 92 mg/dL (ref 65.0–110.0)
POC Glucose: 96 mg/dL (ref 65.0–110.0)

## 2024-01-16 MED ORDER — NORMAL SALINE FLUSH 0.9 % IV SOLN
0.9 | INTRAVENOUS | Status: DC | PRN
Start: 2024-01-16 — End: 2024-01-16

## 2024-01-16 MED ORDER — ONDANSETRON HCL 4 MG/2ML IJ SOLN
42 | Freq: Once | INTRAMUSCULAR | Status: DC | PRN
Start: 2024-01-16 — End: 2024-01-16

## 2024-01-16 MED ORDER — EPHEDRINE SULFATE (PRESSORS) 50 MG/ML IV SOLN
50 | Freq: Once | INTRAVENOUS | Status: DC | PRN
Start: 2024-01-16 — End: 2024-01-16
  Administered 2024-01-16: 17:00:00 10 via INTRAVENOUS
  Administered 2024-01-16 (×2): 5 via INTRAVENOUS

## 2024-01-16 MED ORDER — MIDAZOLAM HCL 2 MG/2ML IJ SOLN
22 | Freq: Once | INTRAMUSCULAR | Status: DC | PRN
Start: 2024-01-16 — End: 2024-01-16
  Administered 2024-01-16: 16:00:00 2 via INTRAVENOUS

## 2024-01-16 MED ORDER — PROPOFOL 200 MG/20ML IV EMUL
20020 | Freq: Once | INTRAVENOUS | Status: DC | PRN
Start: 2024-01-16 — End: 2024-01-16
  Administered 2024-01-16: 17:00:00 200 via INTRAVENOUS

## 2024-01-16 MED ORDER — DEXMEDETOMIDINE HCL 200 MCG/2ML IV SOLN
2002 | Freq: Once | INTRAVENOUS | Status: DC | PRN
Start: 2024-01-16 — End: 2024-01-16
  Administered 2024-01-16: 17:00:00 10 via INTRAVENOUS

## 2024-01-16 MED ORDER — NORMAL SALINE FLUSH 0.9 % IV SOLN
0.9 % | INTRAVENOUS | Status: AC | PRN
Start: 2024-01-16 — End: 2024-01-19
  Administered 2024-01-17: 19:00:00 10 mL via INTRAVENOUS

## 2024-01-16 MED ORDER — DIPHENHYDRAMINE HCL 50 MG/ML IJ SOLN
50 | Freq: Once | INTRAMUSCULAR | Status: DC | PRN
Start: 2024-01-16 — End: 2024-01-16

## 2024-01-16 MED ORDER — GLUCOSE 4 G PO CHEW
4 | ORAL | Status: DC | PRN
Start: 2024-01-16 — End: 2024-01-16

## 2024-01-16 MED ORDER — ROCURONIUM BROMIDE 50 MG/5ML IV SOLN
505 | INTRAVENOUS | Status: AC
Start: 2024-01-16 — End: ?

## 2024-01-16 MED ORDER — HYDROCODONE-ACETAMINOPHEN 10-325 MG PO TABS
10-325 | Freq: Four times a day (QID) | ORAL | Status: DC | PRN
Start: 2024-01-16 — End: 2024-01-19
  Administered 2024-01-17 – 2024-01-19 (×5): 1 via ORAL

## 2024-01-16 MED ORDER — SUCCINYLCHOLINE CHLORIDE 20 MG/ML IJ SOLN
20 | Freq: Once | INTRAMUSCULAR | Status: DC | PRN
Start: 2024-01-16 — End: 2024-01-16
  Administered 2024-01-16: 17:00:00 160 via INTRAVENOUS

## 2024-01-16 MED ORDER — HYDROMORPHONE HCL 1 MG/ML IJ SOLN
1 | INTRAMUSCULAR | Status: DC | PRN
Start: 2024-01-16 — End: 2024-01-16

## 2024-01-16 MED ORDER — DEXTROSE 10 % IV SOLN
10 | INTRAVENOUS | Status: DC | PRN
Start: 2024-01-16 — End: 2024-01-16

## 2024-01-16 MED ORDER — ACETAMINOPHEN 325 MG PO TABS
325 | Freq: Once | ORAL | Status: DC | PRN
Start: 2024-01-16 — End: 2024-01-16

## 2024-01-16 MED ORDER — GLUCAGON HCL (DIAGNOSTIC) 1 MG IJ SOLR
1 | INTRAMUSCULAR | Status: DC | PRN
Start: 2024-01-16 — End: 2024-01-16

## 2024-01-16 MED ORDER — OXYCODONE HCL 5 MG PO TABS
5 | Freq: Once | ORAL | Status: DC | PRN
Start: 2024-01-16 — End: 2024-01-16

## 2024-01-16 MED ORDER — LACTATED RINGERS IV SOLN
INTRAVENOUS | Status: DC | PRN
Start: 2024-01-16 — End: 2024-01-16
  Administered 2024-01-16: 16:00:00 via INTRAVENOUS

## 2024-01-16 MED ORDER — DEXTROSE 10 % IV BOLUS
INTRAVENOUS | Status: DC | PRN
Start: 2024-01-16 — End: 2024-01-16

## 2024-01-16 MED ORDER — FENTANYL CITRATE (PF) 100 MCG/2ML IJ SOLN
1002 | INTRAMUSCULAR | Status: AC
Start: 2024-01-16 — End: ?

## 2024-01-16 MED ORDER — SUCCINYLCHOLINE CHLORIDE 20 MG/ML IJ SOLN
20 | INTRAMUSCULAR | Status: AC
Start: 2024-01-16 — End: ?

## 2024-01-16 MED ORDER — MORPHINE SULFATE 2 MG/ML IJ SOLN
2 | INTRAMUSCULAR | Status: DC | PRN
Start: 2024-01-16 — End: 2024-01-19
  Administered 2024-01-17 – 2024-01-18 (×4): 2 mg via INTRAVENOUS

## 2024-01-16 MED ORDER — LIDOCAINE HCL (PF) 2 % IJ SOLN
2 | Freq: Once | INTRAMUSCULAR | Status: DC | PRN
Start: 2024-01-16 — End: 2024-01-16
  Administered 2024-01-16: 17:00:00 100 via INTRAVENOUS

## 2024-01-16 MED ORDER — CEFAZOLIN SODIUM-DEXTROSE 2-3 GM-%(50ML) IV SOLR
20003 | INTRAVENOUS | Status: AC
Start: 2024-01-16 — End: 2024-01-16
  Administered 2024-01-16: 17:00:00 2000 mg via INTRAVENOUS

## 2024-01-16 MED ORDER — ONDANSETRON 4 MG PO TBDP
4 MG | Freq: Three times a day (TID) | ORAL | Status: AC | PRN
Start: 2024-01-16 — End: 2024-01-19

## 2024-01-16 MED ORDER — LIDOCAINE HCL (PF) 2 % IJ SOLN
2 | INTRAMUSCULAR | Status: AC
Start: 2024-01-16 — End: ?

## 2024-01-16 MED ORDER — LIDOCAINE HCL (PF) 1 % IJ SOLN
1 | Freq: Once | INTRAMUSCULAR | Status: AC | PRN
Start: 2024-01-16 — End: 2024-01-16
  Administered 2024-01-16: 15:00:00 0.1 mL via INTRADERMAL

## 2024-01-16 MED ORDER — IPRATROPIUM-ALBUTEROL 0.5-2.5 (3) MG/3ML IN SOLN
0.5-2.533 | Freq: Once | RESPIRATORY_TRACT | Status: DC | PRN
Start: 2024-01-16 — End: 2024-01-16

## 2024-01-16 MED ORDER — SODIUM CHLORIDE 0.9 % IV SOLN
0.9 | INTRAVENOUS | Status: DC | PRN
Start: 2024-01-16 — End: 2024-01-16

## 2024-01-16 MED ORDER — TAMSULOSIN HCL 0.4 MG PO CAPS
0.4 MG | Freq: Two times a day (BID) | ORAL | Status: AC
Start: 2024-01-16 — End: 2024-01-19
  Administered 2024-01-17 – 2024-01-19 (×6): 0.4 mg via ORAL

## 2024-01-16 MED ORDER — NICOTINE 14 MG/24HR TD PT24
1424 MG/24HR | Freq: Every day | TRANSDERMAL | Status: AC
Start: 2024-01-16 — End: 2024-01-19
  Administered 2024-01-16 – 2024-01-19 (×4): 1 via TRANSDERMAL

## 2024-01-16 MED ORDER — ONDANSETRON HCL 4 MG/2ML IJ SOLN
42 | Freq: Once | INTRAMUSCULAR | Status: DC | PRN
Start: 2024-01-16 — End: 2024-01-16
  Administered 2024-01-16: 17:00:00 4 via INTRAVENOUS

## 2024-01-16 MED ORDER — DROPERIDOL 2.5 MG/ML IJ SOLN
2.5 | Freq: Once | INTRAMUSCULAR | Status: DC | PRN
Start: 2024-01-16 — End: 2024-01-16

## 2024-01-16 MED ORDER — FENTANYL CITRATE (PF) 100 MCG/2ML IJ SOLN
1002 | Freq: Once | INTRAMUSCULAR | Status: DC | PRN
Start: 2024-01-16 — End: 2024-01-16
  Administered 2024-01-16 (×2): 50 via INTRAVENOUS

## 2024-01-16 MED ORDER — SODIUM CHLORIDE 0.45 % IV SOLN
0.45 % | INTRAVENOUS | Status: AC
Start: 2024-01-16 — End: 2024-01-19
  Administered 2024-01-16 – 2024-01-19 (×6): via INTRAVENOUS

## 2024-01-16 MED ORDER — EPHEDRINE SULFATE (PRESSORS) 50 MG/ML IV SOLN
50 | INTRAVENOUS | Status: AC
Start: 2024-01-16 — End: ?

## 2024-01-16 MED ORDER — SODIUM CHLORIDE 0.9 % IV SOLN
0.9 % | INTRAVENOUS | Status: AC | PRN
Start: 2024-01-16 — End: 2024-01-19

## 2024-01-16 MED ORDER — SODIUM CHLORIDE (PF) 0.9 % IJ SOLN
0.9 | INTRAMUSCULAR | Status: DC | PRN
Start: 2024-01-16 — End: 2024-01-16

## 2024-01-16 MED ORDER — HYDRALAZINE HCL 20 MG/ML IJ SOLN
20 | INTRAMUSCULAR | Status: DC | PRN
Start: 2024-01-16 — End: 2024-01-16

## 2024-01-16 MED ORDER — ONDANSETRON HCL 4 MG/2ML IJ SOLN
42 MG/2ML | Freq: Four times a day (QID) | INTRAMUSCULAR | Status: AC | PRN
Start: 2024-01-16 — End: 2024-01-19

## 2024-01-16 MED ORDER — DEXAMETHASONE SODIUM PHOSPHATE 4 MG/ML IJ SOLN
4 | Freq: Once | INTRAMUSCULAR | Status: DC | PRN
Start: 2024-01-16 — End: 2024-01-16
  Administered 2024-01-16: 17:00:00 8 via INTRAVENOUS

## 2024-01-16 MED ORDER — NORMAL SALINE FLUSH 0.9 % IV SOLN
0.9 | Freq: Two times a day (BID) | INTRAVENOUS | Status: DC
Start: 2024-01-16 — End: 2024-01-16

## 2024-01-16 MED ORDER — HYDROMORPHONE HCL 2 MG PO TABS
2 MG | ORAL | Status: AC | PRN
Start: 2024-01-16 — End: 2024-01-19
  Administered 2024-01-16 – 2024-01-18 (×6): 2 mg via ORAL

## 2024-01-16 MED ORDER — ONDANSETRON HCL 4 MG/2ML IJ SOLN
42 | INTRAMUSCULAR | Status: AC
Start: 2024-01-16 — End: ?

## 2024-01-16 MED ORDER — HYDROMORPHONE HCL 2 MG PO TABS
2 | ORAL | Status: DC | PRN
Start: 2024-01-16 — End: 2024-01-19
  Administered 2024-01-17: 01:00:00 1 mg via ORAL

## 2024-01-16 MED ORDER — LABETALOL HCL 5 MG/ML IV SOLN
5 | INTRAVENOUS | Status: DC | PRN
Start: 2024-01-16 — End: 2024-01-16

## 2024-01-16 MED ORDER — PROPOFOL 200 MG/20ML IV EMUL
20020 | INTRAVENOUS | Status: AC
Start: 2024-01-16 — End: ?

## 2024-01-16 MED ORDER — METFORMIN HCL ER 750 MG PO TB24
750 MG | Freq: Every evening | ORAL | Status: AC
Start: 2024-01-16 — End: 2024-01-19
  Administered 2024-01-17 – 2024-01-19 (×3): 750 mg via ORAL

## 2024-01-16 MED ORDER — FINASTERIDE 5 MG PO TABS
5 MG | Freq: Every day | ORAL | Status: AC
Start: 2024-01-16 — End: 2024-01-19
  Administered 2024-01-17 – 2024-01-19 (×3): 5 mg via ORAL

## 2024-01-16 MED ORDER — HYDROMORPHONE HCL 1 MG/ML IJ SOLN
1 | INTRAMUSCULAR | Status: DC | PRN
Start: 2024-01-16 — End: 2024-01-16
  Administered 2024-01-16: 19:00:00 0.5 mg via INTRAVENOUS

## 2024-01-16 MED ORDER — DEXAMETHASONE SODIUM PHOSPHATE 4 MG/ML IJ SOLN
4 | INTRAMUSCULAR | Status: AC
Start: 2024-01-16 — End: ?

## 2024-01-16 MED ORDER — LACTATED RINGERS IV SOLN
INTRAVENOUS | Status: DC
Start: 2024-01-16 — End: 2024-01-16

## 2024-01-16 MED ORDER — IBUPROFEN 200 MG PO TABS
200 MG | Freq: Three times a day (TID) | ORAL | Status: AC | PRN
Start: 2024-01-16 — End: 2024-01-19
  Administered 2024-01-19: 03:00:00 600 mg via ORAL

## 2024-01-16 MED ORDER — MIDAZOLAM HCL 2 MG/2ML IJ SOLN
22 | INTRAMUSCULAR | Status: AC
Start: 2024-01-16 — End: ?

## 2024-01-16 MED ORDER — NORMAL SALINE FLUSH 0.9 % IV SOLN
0.9 % | Freq: Two times a day (BID) | INTRAVENOUS | Status: AC
Start: 2024-01-16 — End: 2024-01-19
  Administered 2024-01-17 – 2024-01-19 (×5): 10 mL via INTRAVENOUS

## 2024-01-16 MED FILL — NORMAL SALINE FLUSH 0.9 % IV SOLN: 0.9 % | INTRAVENOUS | Qty: 10

## 2024-01-16 MED FILL — DILAUDID 1 MG/ML IJ SOLN: 1 MG/ML | INTRAMUSCULAR | Qty: 0.5

## 2024-01-16 MED FILL — ONDANSETRON HCL 4 MG/2ML IJ SOLN: 42 MG/2ML | INTRAMUSCULAR | Qty: 2

## 2024-01-16 MED FILL — MIDAZOLAM HCL 2 MG/2ML IJ SOLN: 22 MG/ML | INTRAMUSCULAR | Qty: 2

## 2024-01-16 MED FILL — ANECTINE 20 MG/ML IJ SOLN: 20 MG/ML | INTRAMUSCULAR | Qty: 10

## 2024-01-16 MED FILL — EPHEDRINE SULFATE (PRESSORS) 50 MG/ML IV SOLN: 50 MG/ML | INTRAVENOUS | Qty: 1

## 2024-01-16 MED FILL — DIPRIVAN 200 MG/20ML IV EMUL: 20020 MG/20ML | INTRAVENOUS | Qty: 20

## 2024-01-16 MED FILL — ROCURONIUM BROMIDE 50 MG/5ML IV SOLN: 505 MG/5ML | INTRAVENOUS | Qty: 5

## 2024-01-16 MED FILL — DEXAMETHASONE SODIUM PHOSPHATE 4 MG/ML IJ SOLN: 4 MG/ML | INTRAMUSCULAR | Qty: 2

## 2024-01-16 MED FILL — TAMSULOSIN HCL 0.4 MG PO CAPS: 0.4 MG | ORAL | Qty: 1

## 2024-01-16 MED FILL — XYLOCAINE-MPF 2 % IJ SOLN: 2 % | INTRAMUSCULAR | Qty: 5

## 2024-01-16 MED FILL — FENTANYL CITRATE (PF) 100 MCG/2ML IJ SOLN: 1002 MCG/2ML | INTRAMUSCULAR | Qty: 2

## 2024-01-16 MED FILL — XYLOCAINE-MPF 1 % IJ SOLN: 1 % | INTRAMUSCULAR | Qty: 2

## 2024-01-16 MED FILL — NICOTINE 14 MG/24HR TD PT24: 1424 MG/24HR | TRANSDERMAL | Qty: 1

## 2024-01-16 MED FILL — HYDROMORPHONE HCL 2 MG PO TABS: 2 MG | ORAL | Qty: 1

## 2024-01-16 MED FILL — CEFAZOLIN SODIUM-DEXTROSE 2-3 GM-%(50ML) IV SOLR: 20003 mg in Dextrose 3% | INTRAVENOUS | Qty: 50

## 2024-01-16 NOTE — Progress Notes (Signed)
Chaplain Spiritual Care Visit:    The Chaplain checked on the patient and his loved one. The patient was in good spirits. The patient did not express any spiritual crisis or concerns.      The Chaplain provided encouragement and a non-anxious presence.   01/16/24 1100   Encounter Summary   Encounter Overview/Reason Initial Encounter;Pre-Procedural   Service Provided For Patient   Referral/Consult From Rounding   Support System Spouse   Complexity of Encounter Low   Spiritual/Emotional needs   Type Spiritual Support   Assessment/Intervention/Outcome   Assessment Calm;Coping   Intervention Sustaining Presence/Ministry of presence;Nurtured Hope   Outcome Coping     Debe Coder, Stephannie Li, [facility]  Pastoral Care Department  Staff Chaplain

## 2024-01-16 NOTE — H&P (Signed)
Urology Attending Admission Note      Reason for Admission: Increasing obstruction with BPH with nocturia urgency and frequency studies have shown increasing bladder dysfunction for IPP later in the year will need his outlet obstruction dealt with first    History: Patient was followed for ED and BPH with increasing obstructive symptoms had a priapism secondary to injection therapy and now has not regained erections will need an IPP later in the year his BPH symptoms have also increased urodynamic show high flow high pressure and cystoscopy shows a very large median lobe which is intravesical and obstructing prostate here for relief of bladder neck obstruction    Meds: see med rec  Family History, Social History, Review of Systems:  Reviewed and agreed to as per chart    Exam:    Vitals:  Ht 1.854 m (6\' 1" )   Wt 114.3 kg (252 lb)   BMI 33.25 kg/m   No data recorded  No intake or output data in the 24 hours ending 01/16/24 0906    Physical:   Well developed, well nourished in no acute distress  Mood indicates no abnormalities. Pt doesn't appear depressed  Orientated to time and place  Neck is supple, trachea is midline  Respiratory effort is normal  Cardiovascular show no extremity swelling  Abdomen no masses or hernias are palpated, there is no tenderness. Liver and Spleen appear normal.  Skin show no abnormal lesions  Lymph nodes are not palpated in the inguinal, neck, or axillary area.     Male GU:  Penis appears normal and circumcised  Urethral meatus is normal in size and location  Scrotum appears normal and both testicles appear normal in size and location  Sphincter has good tone  Anus is inspected. There are no perineal masses  Prostate is moderately enlarged without nodule, no tenderness and no induration  Seminal Vesicles are not palpable      Labs:  WBC:    Lab Results   Component Value Date/Time    WBC 11.6 01/13/2024 12:49 PM     Hemoglobin/Hematocrit:    Lab Results   Component Value Date/Time    HGB  15.5 01/13/2024 12:49 PM    HCT 46.6 01/13/2024 12:49 PM     BMP:    Lab Results   Component Value Date/Time    NA 137 07/19/2023 06:43 AM    K 3.9 07/19/2023 06:43 AM    CL 102 07/19/2023 06:43 AM    CO2 27 07/19/2023 06:43 AM    BUN 13 07/19/2023 06:43 AM    CREATININE 0.9 07/19/2023 06:43 AM    CALCIUM 8.5 07/19/2023 06:43 AM    LABGLOM 99 07/19/2023 06:43 AM    LABGLOM 103 05/19/2022 06:42 AM     PT/INR:  No results found for: "PROTIME", "INR"  PTT:  No results found for: "APTT"[APTT    Urinalysis: Negative    Imaging: Large obstructing intravesical median lobe on cystoscopy    Impression/Plan: Increasing obstructive symptoms cystoscopy shows an obstructing large median intravesical lobe voiding studies have shown increasing bladder pressures and flows.  Patient will need a IPP and should have bladder outlet obstruction dealt with prior    Clarisse Gouge, MD

## 2024-01-16 NOTE — Progress Notes (Signed)
Reported to Dr. Edyth Gunnels that pt ate mac & cheese and eggs yesterday, pt said he did not know he couldn't eat yesterday. Dr. Edyth Gunnels said OK to proceed not going to cancel as long as he did not eat today.

## 2024-01-16 NOTE — Anesthesia Pre-Procedure Evaluation (Addendum)
Department of Anesthesiology  Preprocedure Note       Name:  Garrett Manning   Age:  58 y.o.  DOB:  06/24/1966                                          MRN:  [id number]         Date:  01/16/2024      Surgeon: Moishe Spice):  Ramsay, Stefanie Libel, MD    Procedure: Procedure(s):  CYSTOSCOPY TRANSURETHRAL RESECTION PROSTATE    Medications prior to admission:   Prior to Admission medications    Medication Sig Start Date End Date Taking? Authorizing Provider   HYDROcodone-acetaminophen (NORCO) 10-325 MG per tablet Take 1 tablet by mouth every 6 hours as needed for Pain. Max Daily Amount: 4 tablets   Yes [provider]   metFORMIN (GLUCOPHAGE-XR) 750 MG extended release tablet Take 1 tablet by mouth at bedtime   Yes [provider]   tamsulosin (FLOMAX) 0.4 MG capsule Take 1 capsule by mouth in the morning and at bedtime   Yes [provider]   finasteride (PROSCAR) 5 MG tablet Take 1 tablet by mouth daily   Yes [provider]   Semaglutide, 1 MG/DOSE, (OZEMPIC, 1 MG/DOSE,) 2 MG/1.5ML SOPN Inject into the skin every 7 days sunday    [provider]       Current medications:    Current Facility-Administered Medications   Medication Dose Route Frequency Provider Last Rate Last Admin   . sodium chloride flush 0.9 % injection 5-40 mL  5-40 mL IntraVENous 2 times per day Ramsay, Stefanie Libel, MD       . sodium chloride flush 0.9 % injection 5-40 mL  5-40 mL IntraVENous PRN Ramsay, Stefanie Libel, MD       . 0.9 % sodium chloride infusion   IntraVENous PRN Ramsay, Stefanie Libel, MD       . ceFAZolin (ANCEF) 2000 mg in dextrose 3 % 50 mL IVPB (duplex)  2,000 mg IntraVENous On Call to OR Ramsay, Stefanie Libel, MD       . sodium chloride flush 0.9 % injection 5-40 mL  5-40 mL IntraVENous PRN Ramsay, Stefanie Libel, MD       . lactated ringers infusion   IntraVENous Continuous Ramsay, Stefanie Libel, MD           Allergies:  No Known Allergies    Problem List:     Patient Active Problem List   Diagnosis Code   . S/P transurethral resection of prostate Z90.79   . Priapism N48.30   . Enlarged prostate with urinary obstruction N40.1, N13.8       Past Medical History:        Diagnosis Date   . Back pain    . Diabetes (HCC)    . Enlarged prostate    . Enlarged prostate with urinary obstruction    . Wears glasses        Past Surgical History:        Procedure Laterality Date   . FOOT SURGERY Bilateral    . KNEE ARTHROSCOPY Right    . LUMBAR SPINE SURGERY     . PENIS SURGERY N/A 07/19/2023    PENIS FRACTURE REPAIR performed by Ramsay, Stefanie Libel, MD at [facility] MAIN OR   . TONSILLECTOMY     . TURP N/A 05/17/2022  CYSTOSCOPY TRANSURETHRAL RESECTION PROSTATE performed by Clarisse Gouge, MD at [facility] MAIN OR       Social History:    Social History     Tobacco Use   . Smoking status: Every Day     Current packs/day: 1.00     Types: Cigarettes   . Smokeless tobacco: Not on file   Substance Use Topics   . Alcohol use: Never                                Ready to quit: Not Answered  Counseling given: Not Answered      Vital Signs (Current):   Vitals:    01/10/24 0824 01/16/24 0915   BP:  116/81   Pulse:  81   Resp:  16   Temp:  97.8 F (36.6 C)   TempSrc:  Oral   SpO2:  94%   Weight: 114.3 kg (252 lb) 109.1 kg (240 lb 8.4 oz)   Height: 1.854 m (6\' 1" ) 1.854 m (6' 0.99")                                              BP Readings from Last 3 Encounters:   01/16/24 116/81   07/20/23 132/77   05/19/22 120/87       NPO Status: Time of last liquid consumption: 2359                        Time of last solid consumption: 1900                        Date of last liquid consumption: 01/15/24                        Date of last solid food consumption: 01/15/24    BMI:   Wt Readings from Last 3 Encounters:   01/16/24 109.1 kg (240 lb 8.4 oz)   07/18/23 114.8 kg (253 lb)   05/19/22 116.1 kg (256 lb)     Body mass index is 31.74 kg/m.    CBC:   Lab Results   Component Value Date/Time     WBC 11.6 01/13/2024 12:49 PM    RBC 5.19 01/13/2024 12:49 PM    HGB 15.5 01/13/2024 12:49 PM    HCT 46.6 01/13/2024 12:49 PM    MCV 89.8 01/13/2024 12:49 PM    RDW 15.1 01/13/2024 12:49 PM    PLT 298 01/13/2024 12:49 PM       CMP:   Lab Results   Component Value Date/Time    NA 137 07/19/2023 06:43 AM    K 3.9 07/19/2023 06:43 AM    CL 102 07/19/2023 06:43 AM    CO2 27 07/19/2023 06:43 AM    BUN 13 07/19/2023 06:43 AM    CREATININE 0.9 07/19/2023 06:43 AM    LABGLOM 99 07/19/2023 06:43 AM    LABGLOM 103 05/19/2022 06:42 AM    GLUCOSE 105 07/19/2023 06:43 AM    CALCIUM 8.5 07/19/2023 06:43 AM       POC Tests:   Recent Labs     01/16/24  0937   POCGLU 92.0       Coags: No results found for: "PROTIME", "INR", "  APTT"    HCG (If Applicable): No results found for: "PREGTESTUR", "PREGSERUM", "HCG", "HCGQUANT"     ABGs: No results found for: "PHART", "PO2ART", "PCO2ART", "HCO3ART", "BEART", "O2SATART"     Type & Screen (If Applicable):  No results found for: "ABORH", "LABANTI"    Drug/Infectious Status (If Applicable):  No results found for: "HIV", "HEPCAB"    COVID-19 Screening (If Applicable): No results found for: "COVID19"        Anesthesia Evaluation  Patient summary reviewed and Nursing notes reviewed   no history of anesthetic complications:   Airway: Mallampati: III  TM distance: >3 FB   Neck ROM: full  Mouth opening: > = 3 FB   Dental:    (+) poor dentition  Comment: Missing all but some front lower teeth, patient reports none are loose    Pulmonary:   (+)       decreased breath sounds: bibasilar    current smoker (Hdeavy 40+ pack-year smoking history)          Patient smoked on day of surgery.                ROS comment: +Chronic cough   Cardiovascular:Negative CV ROS  Exercise tolerance: good (>4 METS)      (-) murmur, weak pulses and peripheral edema      Rhythm: regular  Rate: normal           Beta Blocker:  Not on Beta Blocker         Neuro/Psych:   Negative Neuro/Psych ROS              GI/Hepatic/Renal: Neg  GI/Hepatic/Renal ROS            Endo/Other:    (+) DiabetesType II DM.                 Abdominal: normal exam      Abdomen: soft.      Vascular: negative vascular ROS.         Other Findings:       Anesthesia Plan      general     ASA 3     (Patient was nonadherent to preoperative fasting guidelines for patients on semaglutide.  Therefore, we will do GETA with RSI to mitigate the risks of aspiration on induction.)  Induction: intravenous and rapid sequence.    MIPS: Postoperative opioids intended and Prophylactic antiemetics administered.  Anesthetic plan and risks discussed with patient and child/children.    Use of blood products discussed with patient and child/children whom consented to blood products.    Plan discussed with CRNA.    Attending anesthesiologist reviewed and agrees with Preprocedure content            Georgia Duff, MD   01/16/2024

## 2024-01-16 NOTE — Anesthesia Post-Procedure Evaluation (Signed)
Department of Anesthesiology  Postprocedure Note    Patient: Garrett Manning  MRN: [id number]  Birthdate: May 01, 1966  Date of evaluation: 01/16/2024    Procedure Summary       Date: 01/16/24 Room / Location: [facility] OR 08 / [facility] MA[facility] OR    Anesthesia Start: 1126 Anesthesia Stop: 1236    Procedure: CYSTOSCOPY TRANSURETHRAL RESECTION PROSTATE (Bladder) Diagnosis:       Enlarged prostate with urinary obstruction      (Enlarged prostate with urinary obstruction [N40.1, N13.8])    Surgeons: Tanna Savoy Stefanie Libel, MD Responsible Provider: Georgia Duff, MD    Anesthesia Type: general ASA Status: 3            Anesthesia Type: No value filed.    Aldrete Phase I: Aldrete Score: 9    Aldrete Phase II:      Anesthesia Post Evaluation    Patient location during evaluation: PACU  Patient participation: complete - patient participated  Level of consciousness: awake and alert  Pain scale: See EMR.  Airway patency: patent  Nausea & Vomiting: no nausea and no vomiting  Cardiovascular status: hemodynamically stable and blood pressure returned to baseline  Respiratory status: acceptable, room air and spontaneous ventilation  Hydration status: euvolemic  Comments: Vitals reviewed in EMR.  Multimodal analgesia pain management approach  Pain management: adequate    No notable events documented.

## 2024-01-16 NOTE — Op Note (Signed)
Operative Note      Patient: Garrett Manning  Date of Birth: 08-Oct-1966  MRN: [id number]    Date of Procedure: 02/13/24    Pre-Op Diagnosis Codes:      * Enlarged prostate with urinary obstruction [N40.1, N13.8]    Post-Op Diagnosis: Same       Procedure(s):  CYSTOSCOPY TRANSURETHRAL RESECTION PROSTATE    Surgeon(s):  Novalie Leamy, Stefanie Libel, MD    Assistant:   * No surgical staff found *    Anesthesia: General    Estimated Blood Loss (mL): Minimal    Complications: None    Specimens:   ID Type Source Tests Collected by Time Destination   A : prostate chips Tissue Tissue SURGICAL PATHOLOGY Rasheen Schewe, Stefanie Libel, MD 2024/02/13 1219        Implants:  * No implants in log *      Drains: * No LDAs found *    Findings:  Infection Present At Time Of Surgery (PATOS) (choose all levels that have infection present):  No infection present  Other Findings: Large intravesical lobe    Detailed Description of Procedure:   The patient brought the operating room and general endotracheal anesthesia was established a standard timeout was done he was given preoperative antibiotics placed in the low lithotomy position the penis during her sterilely prepped and draped cystoscopy was carried out with the visual obturator revealing a normal urethra and obstructing prostate with large lateral lobes 1 in the left side extending into the bladder and completely obstructing at the bladder neck there was a moderate median lobe the bladder had 3+ trabeculation no lesions no stones we then converted to the loop resectoscope and resected the obstructing prostatic tissue with limits of the resection where the intravesical lobe to the verumontanum resection was carried down to the surgical capsule and circular fibers once resection was complete the chips were irrigated free of the bladder careful inspection showed no further obstructing tissue and no significant bleeding a hematuria three-way catheter was inserted and irrigated clear after the  chips were removed the patient was stable throughout the case and was transferred recovery in stable condition    Electronically signed by Clarisse Gouge, MD on 02/13/24 at 12:31 PM

## 2024-01-16 NOTE — Anesthesia Procedure Notes (Signed)
Airway  Date/Time: 01/16/2024 11:33 AM  Urgency: elective    Airway not difficult    General Information and Staff    Patient location during procedure: OR  Resident/CRNA: Real Cons, APRN - CRNA  Performed: resident/CRNA   Performed by: Real Cons, APRN - CRNA  Authorized by: [place] Duff, MD      Indications and Patient Condition  Indications for airway management: anesthesia  Spontaneous Ventilation: absent  Sedation level: deep  Preoxygenated: yes  Patient position: sniffing  MILS maintained throughout  Mask difficulty assessment: not attempted    Final Airway Details  Final airway type: endotracheal airway      Successful airway: ETT  Cuffed: yes   Facilitating devices/methods: intubating stylet  Endotracheal tube insertion site: oral  Blade size: #4  ETT size (mm): 7.5  Placement verified by: chest auscultation, capnometry and palpation of cuff   Inital cuff pressure (cm H2O): 5  Measured from: teeth  ETT to teeth (cm): 22  Number of attempts at approach: 1  Ventilation between attempts: bag mask  Number of other approaches attempted: 0    Additional Comments  Smooth IVI, RSI. Easy, atraumatic intubation. Dentition and lips unchanged from preop assessment.

## 2024-01-17 LAB — CBC WITH AUTO DIFFERENTIAL
Basophils %: 0.2 % (ref 0.0–2.0)
Basophils Absolute: 0 10*3/uL (ref 0.0–0.2)
Eosinophils %: 1.1 % (ref 0.0–7.0)
Eosinophils Absolute: 0.2 10*3/uL (ref 0.0–0.5)
Hematocrit: 41.3 % (ref 38.0–52.0)
Hemoglobin: 13.8 g/dL (ref 13.0–17.3)
Immature Grans (Abs): 0.1 10*3/uL — ABNORMAL HIGH (ref 0.00–0.06)
Immature Granulocytes %: 0.6 % (ref 0.0–0.6)
Lymphocytes Absolute: 2.2 10*3/uL (ref 1.0–3.2)
Lymphocytes: 12.9 % — ABNORMAL LOW (ref 15.0–45.0)
MCH: 30.1 pg (ref 27.0–34.5)
MCHC: 33.4 g/dL (ref 30.0–36.0)
MCV: 90.2 fL (ref 84.0–100.0)
MPV: 9.3 fL (ref 7.0–12.2)
Monocytes %: 7.5 % (ref 4.0–12.0)
Monocytes Absolute: 1.3 10*3/uL — ABNORMAL HIGH (ref 0.3–1.0)
NRBC Absolute: 0 10*3/uL (ref 0.000–0.012)
NRBC Automated: 0 % (ref 0.0–0.2)
Neutrophils %: 77.7 % — ABNORMAL HIGH (ref 42.0–74.0)
Neutrophils Absolute: 13.2 10*3/uL — ABNORMAL HIGH (ref 1.6–7.3)
Platelets: 240 10*3/uL (ref 140–440)
RBC: 4.58 x10e6/mcL (ref 4.00–5.60)
RDW: 15.1 % (ref 10.0–17.0)
WBC: 17 10*3/uL — ABNORMAL HIGH (ref 3.8–10.6)

## 2024-01-17 MED FILL — TAMSULOSIN HCL 0.4 MG PO CAPS: 0.4 MG | ORAL | Qty: 1

## 2024-01-17 MED FILL — METFORMIN HCL ER 750 MG PO TB24: 750 MG | ORAL | Qty: 1

## 2024-01-17 MED FILL — NORMAL SALINE FLUSH 0.9 % IV SOLN: 0.9 % | INTRAVENOUS | Qty: 20

## 2024-01-17 MED FILL — FINASTERIDE 5 MG PO TABS: 5 MG | ORAL | Qty: 1

## 2024-01-17 MED FILL — HYDROCODONE-ACETAMINOPHEN 10-325 MG PO TABS: 10-325 MG | ORAL | Qty: 1

## 2024-01-17 MED FILL — NORMAL SALINE FLUSH 0.9 % IV SOLN: 0.9 % | INTRAVENOUS | Qty: 10

## 2024-01-17 MED FILL — HYDROMORPHONE HCL 2 MG PO TABS: 2 MG | ORAL | Qty: 1

## 2024-01-17 MED FILL — MORPHINE SULFATE 2 MG/ML IJ SOLN: 2 mg/mL | INTRAMUSCULAR | Qty: 1

## 2024-01-17 MED FILL — NICOTINE 14 MG/24HR TD PT24: 1424 MG/24HR | TRANSDERMAL | Qty: 1

## 2024-01-17 NOTE — Care Coordination-Inpatient (Addendum)
01/17/24 1128   Service Assessment   Patient Orientation Alert and Oriented   Cognition Alert   History Provided By Patient   Primary Caregiver Self   Accompanied By/Relationship Daughter   Support Systems Spouse/Significant Other;Children   PCP Verified by CM Yes  (No PCP,Pt stated will book an appointment with new PCP)   Prior Functional Level Independent in ADLs/IADLs   Current Functional Level Independent in ADLs/IADLs   Can patient return to prior living arrangement Yes   Ability to make needs known: Good   Family able to assist with home care needs: Yes   Would you like for me to discuss the discharge plan with any other family members/significant others, and if so, who? Yes  (daughter at bedside)   Architect Other (Comment)  ([place] [employer])   [facility] None   CM/SW Referral Other (see comment)  (Discharge Planning)   Social/Functional History   Lives With Spouse;Daughter   Type of Home House   Home Layout One level   Home Access Stairs to enter with rails   Entrance Stairs - Number of Steps 3   Entrance Stairs - Rails Both   Home Equipment [place]   Prior Level of Assist for ADLs Independent   Prior Level of Assist for [employer] Yes   Ambulation Assistance Independent   Prior Level of Assist for Transfers Independent   Active Driver Yes   Discharge Planning   Type of Residence House   Living Arrangements Spouse/Significant Other;Children   Type of Home Care Services None   Patient expects to be discharged to: [facility]     01/17/2024 [facility] - POD #1 : CYSTOSCOPY TRANSURETHRAL RESECTION PROSTATE (Bladder) CM met with pt/daughter at bedside to complete initial assessment.Rx [facility] [place].No PCP,pt stated looking for new PCP.Lives with wife and daughter.No prior HH/IADL's,Uses cane for mobility.Dispo Home with no anticipated  needs.Pt to arrange ride home upon discharge.CM following.  Local address - [address] [place], [place] [address]

## 2024-01-18 LAB — CBC
Hematocrit: 40.1 % (ref 38.0–52.0)
Hemoglobin: 13.1 g/dL (ref 13.0–17.3)
MCH: 30.2 pg (ref 27.0–34.5)
MCHC: 32.7 g/dL (ref 30.0–36.0)
MCV: 92.4 fL (ref 84.0–100.0)
MPV: 9.4 fL (ref 7.0–12.2)
NRBC Absolute: 0 10*3/uL (ref 0.000–0.012)
NRBC Automated: 0 % (ref 0.0–0.2)
Platelets: 238 10*3/uL (ref 140–440)
RBC: 4.34 x10e6/mcL (ref 4.00–5.60)
RDW: 15.2 % (ref 10.0–17.0)
WBC: 12.1 10*3/uL — ABNORMAL HIGH (ref 3.8–10.6)

## 2024-01-18 MED FILL — NORMAL SALINE FLUSH 0.9 % IV SOLN: 0.9 % | INTRAVENOUS | Qty: 10

## 2024-01-18 MED FILL — HYDROMORPHONE HCL 2 MG PO TABS: 2 MG | ORAL | Qty: 1

## 2024-01-18 MED FILL — HYDROCODONE-ACETAMINOPHEN 10-325 MG PO TABS: 10-325 MG | ORAL | Qty: 1

## 2024-01-18 MED FILL — METFORMIN HCL ER 750 MG PO TB24: 750 MG | ORAL | Qty: 1

## 2024-01-18 MED FILL — TAMSULOSIN HCL 0.4 MG PO CAPS: 0.4 MG | ORAL | Qty: 1

## 2024-01-18 MED FILL — MORPHINE SULFATE 2 MG/ML IJ SOLN: 2 mg/mL | INTRAMUSCULAR | Qty: 1

## 2024-01-18 MED FILL — NICOTINE 14 MG/24HR TD PT24: 1424 MG/24HR | TRANSDERMAL | Qty: 1

## 2024-01-18 MED FILL — FINASTERIDE 5 MG PO TABS: 5 MG | ORAL | Qty: 1

## 2024-01-18 NOTE — Other (Signed)
Pt experienced another Blood Clot with his CBI when ambulating.  Clot resolved without any irrigation involvement.

## 2024-01-18 NOTE — Progress Notes (Signed)
Progress Note    Date:01/18/2024       Room:0333/01  Patient Name:Garrett Manning     Date of Birth:06-24-1966     Age:58 y.o.    Assessment        Hospital Problems             Last Modified POA    * (Principal) Enlarged prostate with urinary obstruction 01/09/2024 Unknown    S/P TURP (status post transurethral resection of prostate) 01/16/2024 Yes       Plan:      1. Very close to being able to go home today.  Continue to wean CBI.  Hopefully home in a.m.    Subjective   Interval History Status: not changed.     Postoperative day 2.  Urine pink when he is off CBI.  Hematocrit is stable    Review of Systems   Review of Systems    Medications   Scheduled Meds:    sodium chloride flush  5-40 mL IntraVENous 2 times per day    nicotine  1 patch TransDERmal Daily    finasteride  5 mg Oral Daily    metFORMIN  750 mg Oral Nightly    tamsulosin  0.4 mg Oral BID     Continuous Infusions:    sodium chloride 75 mL/hr at 01/18/24 0520    sodium chloride       PRN Meds: sodium chloride flush, sodium chloride, ibuprofen, HYDROmorphone **OR** HYDROmorphone, morphine, ondansetron **OR** ondansetron, HYDROcodone-acetaminophen    Past History    Past Medical History:   has a past medical history of Back pain, Diabetes (HCC), Enlarged prostate, Enlarged prostate with urinary obstruction, and Wears glasses.    Social History:   reports that he has been smoking cigarettes. He does not have any smokeless tobacco history on file. He reports that he does not drink alcohol and does not use drugs.     Family History: History reviewed. No pertinent family history.    Physical Examination      Vitals:  BP 113/70   Pulse 73   Temp 98.5 F (36.9 C) (Oral)   Resp 18   Ht 1.854 m (6' 0.99")   Wt 117.6 kg (259 lb 3.2 oz)   SpO2 96%   BMI 34.20 kg/m   Temp (24hrs), Avg:98.2 F (36.8 C), Min:97.8 F (36.6 C), Max:98.5 F (36.9 C)      I/O (24Hr):    Intake/Output Summary (Last 24 hours) at 01/18/2024 1035  Last data filed at 01/18/2024 1007  Gross  per 24 hour   Intake 3072.5 ml   Output 31540 ml   Net -13532.5 ml       Physical Exam    Labs/Imaging/Diagnostics   Labs:  CBC:  Recent Labs     01/17/24  1157 01/18/24  1027   WBC 17.0* 12.1*   RBC 4.58 4.34   HGB 13.8 13.1   HCT 41.3 40.1   MCV 90.2 92.4   RDW 15.1 15.2   PLT 240 238     CHEMISTRIES:No results for input(s): "NA", "K", "CL", "CO2", "BUN", "CREATININE", "GLUCOSE", "PHOS", "MG" in the last 72 hours.    Invalid input(s): "CA"  PT/INR:No results for input(s): "PROTIME", "INR" in the last 72 hours.  APTT:No results for input(s): "APTT" in the last 72 hours.  LIVER PROFILE:No results for input(s): "AST", "ALT", "BILIDIR", "BILITOT", "ALKPHOS" in the last 72 hours.    Imaging Last 24 Hours:  No results found.  Electronically signed by Little Ishikawa, MD on 01/18/24 at 10:35 AM EST

## 2024-01-19 MED ORDER — PHENAZOPYRIDINE HCL 200 MG PO TABS
200 | ORAL_TABLET | Freq: Three times a day (TID) | ORAL | 0 refills | Status: AC | PRN
Start: 2024-01-19 — End: 2024-01-22

## 2024-01-19 MED ORDER — BISACODYL 5 MG PO TBEC
5 | Freq: Every day | ORAL | Status: DC | PRN
Start: 2024-01-19 — End: 2024-01-19
  Administered 2024-01-19: 01:00:00 10 mg via ORAL

## 2024-01-19 MED FILL — TAMSULOSIN HCL 0.4 MG PO CAPS: 0.4 MG | ORAL | Qty: 1

## 2024-01-19 MED FILL — HYDROCODONE-ACETAMINOPHEN 10-325 MG PO TABS: 10-325 MG | ORAL | Qty: 1

## 2024-01-19 MED FILL — NORMAL SALINE FLUSH 0.9 % IV SOLN: 0.9 % | INTRAVENOUS | Qty: 10

## 2024-01-19 MED FILL — METFORMIN HCL ER 750 MG PO TB24: 750 MG | ORAL | Qty: 1

## 2024-01-19 MED FILL — BISACODYL EC 5 MG PO TBEC: 5 MG | ORAL | Qty: 2

## 2024-01-19 MED FILL — NICOTINE 14 MG/24HR TD PT24: 1424 MG/24HR | TRANSDERMAL | Qty: 1

## 2024-01-19 MED FILL — IBUPROFEN 200 MG PO TABS: 200 MG | ORAL | Qty: 3

## 2024-01-19 MED FILL — FINASTERIDE 5 MG PO TABS: 5 MG | ORAL | Qty: 1

## 2024-01-19 NOTE — Plan of Care (Incomplete)
Pt's foley removed, pt tolerating being

## 2024-01-19 NOTE — Discharge Summary (Signed)
Discharge Summary    Date:01/19/2024        Patient Name:Garrett Manning     Date of Birth:08/01/1966     Age:58 y.o.    Admit Date:01/16/2024   Admission Condition:good   Discharged Condition:good  Discharge Date: 01/19/24     Discharge Diagnoses   Principal Problem:    Enlarged prostate with urinary obstruction  Active Problems:    S/P TURP (status post transurethral resection of prostate)  Resolved Problems:    * No resolved hospital problems. [facility] Stay   Narrative of Hospital Course: Patient was admitted to the hospital and a transurethral resection of the prostate performed by my partner.  He had a relatively uneventful postoperative course with the exception of he had persistent bleeding.  By postoperative day 3 we were able to wean off CBI and remove his Foley.    Consultants:   None    Time Spent on Discharge:  15 minutes were spent in patient examination, evaluation, counseling as well as medication reconciliation, prescriptions for required medications, discharge plan and follow up.      Surgeries/Procedures Performed:  Procedure(s):  CYSTOSCOPY TRANSURETHRAL RESECTION PROSTATE       Treatments:   surgery: TURP    Significant Diagnostic Studies:   Recent Labs:      Radiology Last 7 Days:  No results found.    Discharge Plan   Disposition: Home    Provider Follow-Up:   No follow-up provider specified.     Hospital/Incidental Findings Requiring Follow-Up:      Patient Instructions   Diet: regular diet    Activity: no lifting, Driving, or Strenuous exercise for 2 weeks    Other Instructions:       Discharge Medications         Medication List        STOP taking these medications      Ozempic (1 MG/DOSE) 2 MG/1.5ML Sopn  Generic drug: Semaglutide (1 MG/DOSE)            ASK your doctor about these medications      finasteride 5 MG tablet  Commonly known as: PROSCAR     HYDROcodone-acetaminophen 10-325 MG per tablet  Commonly known as: NORCO     metFORMIN 750 MG extended release tablet  Commonly known as:  GLUCOPHAGE-XR     tamsulosin 0.4 MG capsule  Commonly known as: ZOXWRU              Electronically signed by Little Ishikawa, MD on 01/19/24 at 10:48 AM EST

## 2024-03-20 NOTE — Progress Notes (Signed)
 Pre Procedure Patient Instructions     Procedure Location hospital: [facility]: [address] [place] - Drop off at Outpatient Services to the left of the main hospital entrance and proceed to the gold elevator on your left (past the security desk) to the 2nd floor Surgery Reception desk.   Procedure Date 04/06/24  Arrival Time  10:00    Your doctor determines your scheduled start time.  Depending on the facility where your procedure is taking place and the scheduled start time, you may be required to arrive up to 2 hours prior.  This is to allow time for registration, your preop assessment, any day of procedure testing and to meet with your care teams members.  This also allows your procedure to be performed earlier should there be a cancellation that day.  We appreciate your patience as we work to provide you with excellent service.    Medications: Follow the medication instructions below to prevent your procedure from being cancelled.    Medication to be taken the morning of surgery with a few sips of water only: FINASTERIDE , TAMSULOSIN     Continue taking all medications reviewed with the preadmission nurse EXCEPT follow the instructions below.  Hold or reduce the dosage of the following medication, as discussed:    Hold Cialis/Tadalafil for 3 days prior to procedure.  LAST DOSE 04/02/24         Take METFORMIN  the day/night before as per usual.  DO NOT take the morning of procedure.  Take Ozempic (Semaglutide) as prescribed EXCEPT hold the dose prior to your procedure. LAST DOSE 03/29/24       Do not take over the counter pain medications except plain Tylenol  or Acetaminophen  for seven days prior to your procedure unless your doctor told you otherwise.    If you are taking any diabetes and or weight loss medications (including injectables and shots) not discussed during the call with the PreAdmission Nurse, it is important to call [phone] to discuss important  instructions.    If you are taking blood thinners, and have not been told when to hold the dose, call the doctor performing your procedure for instructions on when to stop.    On the day of your procedure, a nurse will review your medications and ask when you last took each one.        Procedure Preparation    Diet Restrictions:Studies show carbohydrate drinks before procedures are beneficial.  For this reason, Anesthesia asks that you drink 8 oz of non-red Gatorade (sugar free if diabetic) at 6pm and again before midnight the day before your procedure.  If your surgeon instructs you to drink more than 8oz of Gatorade, it's fine to do so.  Your procedure will be cancelled if you do not follow these instructions. Clear liquids ONLY from 12:00 AM (MIDNIGHT) to 11:59 PM the day before your procedure (water, black coffee with no milk or cream, clear juice and without pulp, jello, soda, Ensure Clear, Gatorade, chicken broth, beef broth and bone broth without noodles) and then NO food or drink including gum or mints after 12:00 AM the day of your procedure even if your doctor told you to drink fluids.       When taking OZEMPIC your stomach doesn't empty as quickly and can cause you to inhale your stomach contents while under sedation/anesthesia.  Your doctors will discuss these risks with you further on the day of your procedure.  Even when following the fasting  and medication instructions you may have symptoms such as nausea, vomiting, and/or bloating on the day of your procedure.  Depending on your symptoms, your day of procedure providers may decide that delaying or cancelling your procedure may be the safest decision for you.    If you have questions on the day of your procedure call [facility] Preop at [phone].    Skin Preparation:  Wash with Hibiclens or an antibacterial soap (e.g. Dial soap) the night before and morning of procedure.    Do not put on any deodorants, lotions, powders, or  oils on the day of procedure.  Be sure to put on clean, comfortable loose fitting clothing.    Other Preparation:  Call your doctor right away if you have any fever, cold or flu symptoms    No test required before your procedure.          Day of Procedure Patient Instruction:  Do not smoke, vape, chew tobacco, drink alcohol or use recreational drugs on the day of your procedure  Remove all jewelry, piercings, and metal accessories  Do not wear artificial nails and only clear nail polish on natural nails. Nails must be trimmed to fingertip length to make sure the oxygen probe fits properly and to avoid injury  Do not wear artificial eye lashes because they can cause dry eyes, eye scratches, eye infections and allergic reactions  Do not use hair extensions with metal clips or hairstyles near the back of your neck, as it can make it difficult to safely manage your breathing during anesthesia  If your hair is tightly braided or styled in a complex way, it may need to be undone for your safety  Do not wear contacts, tampons, make-up, lotions, creams, powders, fragrances or deodorant  Wear loose fit clothing  Do not bring valuables or money  Bring a copy of your Living Will and/or Medical Durable Power of Attorney if you have one  Bring a list of current medications including name and dosage  Bring a picture ID and insurance card     Bring a storage case for eyeglasses, hearing aids, dentures, etc  Refer to the doctor's office for day of procedure recommended clothing          If you are going home the same day as your procedure, a support person should accompany you to the facility and must transport you home.  If you plan to take public transportation of any sort, your support person must accompany you home.  You should have someone to stay with you for 24 hours after your procedure with sedation of any kind.       Comments:     This information was reviewed with you during your Pre-Admission Testing interview and you  verbalized understanding. If you have any additional questions please contact [phone].    If you have any scheduling concerns or procedure questions (including questions after your procedure) please contact your physician's office. Ramsay, Aldean Amass, MD  Phone Number: [phone]     To pre-register for your procedure please call [phone] Option 2 and then Option 3.    For financial questions regarding your procedure at a [facility] facility, please contact [phone].    For financial questions regarding anesthesia at a [facility] facility, please  contact [phone].    For MyChart Patient Portal help please call [phone].    For hotel accommodations please visit [email or web address] and select PATIENTS &VISITORS -> "FOR  VISITORS" section and then TRAVEL INFORMATION -> PLACES TO STAY

## 2024-04-06 ENCOUNTER — Inpatient Hospital Stay
Admit: 2024-04-06 | Discharge: 2024-04-07 | Disposition: A | Discharge status: Home / Self Care | Payer: BLUE CROSS/BLUE SHIELD | Admitting: Urology

## 2024-04-06 DIAGNOSIS — N528 Other male erectile dysfunction: Secondary | ICD-10-CM

## 2024-04-06 DIAGNOSIS — N529 Male erectile dysfunction, unspecified: Secondary | ICD-10-CM

## 2024-04-06 DIAGNOSIS — N5239 Other post-surgical erectile dysfunction: Secondary | ICD-10-CM

## 2024-04-06 LAB — POCT GLUCOSE: POC Glucose: 89 mg/dL (ref 65.0–110.0)

## 2024-04-06 MED ORDER — ONDANSETRON 4 MG PO TBDP
4 | Freq: Three times a day (TID) | ORAL | Status: DC | PRN
Start: 2024-04-06 — End: 2024-04-07

## 2024-04-06 MED ORDER — HYDROMORPHONE HCL 2 MG/ML IJ SOLN
2 | Freq: Once | INTRAMUSCULAR | Status: DC | PRN
Start: 2024-04-06 — End: 2024-04-06
  Administered 2024-04-06 (×2): .4 via INTRAVENOUS

## 2024-04-06 MED ORDER — CEFTRIAXONE SODIUM 2 G IJ SOLR
2 | INTRAMUSCULAR | Status: AC
Start: 2024-04-06 — End: 2024-04-06
  Administered 2024-04-06: 16:00:00 2000 mg via INTRAVENOUS

## 2024-04-06 MED ORDER — LIDOCAINE HCL (PF) 1 % IJ SOLN
1 | Freq: Once | INTRAMUSCULAR | Status: AC
Start: 2024-04-06 — End: 2024-04-06
  Administered 2024-04-06: 15:00:00 0.1 mL via INTRADERMAL

## 2024-04-06 MED ORDER — SODIUM CHLORIDE 0.9 % IV SOLN
0.9 | INTRAVENOUS | Status: DC | PRN
Start: 2024-04-06 — End: 2024-04-06

## 2024-04-06 MED ORDER — PHENYLEPHRINE HCL 1 MG/10ML IV SOSY
1 | Freq: Once | INTRAVENOUS | Status: DC | PRN
Start: 2024-04-06 — End: 2024-04-06
  Administered 2024-04-06 (×5): 100 via INTRAVENOUS

## 2024-04-06 MED ORDER — LIDOCAINE HCL (PF) 2 % IJ SOLN
2 | INTRAMUSCULAR | Status: AC
Start: 2024-04-06 — End: ?

## 2024-04-06 MED ORDER — KETOROLAC TROMETHAMINE 30 MG/ML IJ SOLN
30 | INTRAMUSCULAR | Status: AC
Start: 2024-04-06 — End: ?

## 2024-04-06 MED ORDER — NORMAL SALINE FLUSH 0.9 % IV SOLN
0.9 | INTRAVENOUS | Status: DC | PRN
Start: 2024-04-06 — End: 2024-04-06

## 2024-04-06 MED ORDER — SEMAGLUTIDE (2 MG/DOSE) 8 MG/3ML SC SOPN
8 | SUBCUTANEOUS | Status: DC
Start: 2024-04-06 — End: 2024-04-07

## 2024-04-06 MED ORDER — PHENYLEPHRINE HCL 1 MG/10ML IV SOSY
1 | INTRAVENOUS | Status: AC
Start: 2024-04-06 — End: ?

## 2024-04-06 MED ORDER — ONDANSETRON HCL 4 MG/2ML IJ SOLN
4 | Freq: Once | INTRAMUSCULAR | Status: DC | PRN
Start: 2024-04-06 — End: 2024-04-06

## 2024-04-06 MED ORDER — EPHEDRINE SULFATE (PRESSORS) 50 MG/ML IV SOLN
50 | INTRAVENOUS | Status: AC
Start: 2024-04-06 — End: ?

## 2024-04-06 MED ORDER — ONDANSETRON HCL 4 MG/2ML IJ SOLN
4 | Freq: Four times a day (QID) | INTRAMUSCULAR | Status: DC | PRN
Start: 2024-04-06 — End: 2024-04-07

## 2024-04-06 MED ORDER — HYDROMORPHONE HCL 2 MG/ML IJ SOLN
2 | INTRAMUSCULAR | Status: AC
Start: 2024-04-06 — End: ?

## 2024-04-06 MED ORDER — ONDANSETRON HCL 4 MG/2ML IJ SOLN
4 | Freq: Once | INTRAMUSCULAR | Status: DC | PRN
Start: 2024-04-06 — End: 2024-04-06
  Administered 2024-04-06: 17:00:00 4 via INTRAVENOUS

## 2024-04-06 MED ORDER — NORMAL SALINE FLUSH 0.9 % IV SOLN
0.9 | INTRAVENOUS | Status: DC | PRN
Start: 2024-04-06 — End: 2024-04-07

## 2024-04-06 MED ORDER — FENTANYL CITRATE (PF) 100 MCG/2ML IJ SOLN
100 | Freq: Once | INTRAMUSCULAR | Status: DC | PRN
Start: 2024-04-06 — End: 2024-04-06
  Administered 2024-04-06: 16:00:00 100 via INTRAVENOUS

## 2024-04-06 MED ORDER — IPRATROPIUM-ALBUTEROL 0.5-2.5 (3) MG/3ML IN SOLN
0.5-2.5 | Freq: Once | RESPIRATORY_TRACT | Status: DC | PRN
Start: 2024-04-06 — End: 2024-04-06

## 2024-04-06 MED ORDER — LORAZEPAM 2 MG/ML IJ SOLN
2 | INTRAMUSCULAR | Status: AC
Start: 2024-04-06 — End: 2024-04-06
  Administered 2024-04-06: 18:00:00 0.5 via INTRAVENOUS

## 2024-04-06 MED ORDER — EPHEDRINE SULFATE (PRESSORS) 50 MG/ML IV SOLN
50 | Freq: Once | INTRAVENOUS | Status: DC | PRN
Start: 2024-04-06 — End: 2024-04-06
  Administered 2024-04-06 (×2): 5 via INTRAVENOUS

## 2024-04-06 MED ORDER — FENTANYL CITRATE (PF) 100 MCG/2ML IJ SOLN
100 | INTRAMUSCULAR | Status: AC
Start: 2024-04-06 — End: ?

## 2024-04-06 MED ORDER — ROCURONIUM BROMIDE 50 MG/5ML IV SOLN
50 | Freq: Once | INTRAVENOUS | Status: DC | PRN
Start: 2024-04-06 — End: 2024-04-06
  Administered 2024-04-06: 16:00:00 5 via INTRAVENOUS

## 2024-04-06 MED ORDER — HYDROMORPHONE HCL 2 MG PO TABS
2 | ORAL | Status: DC | PRN
Start: 2024-04-06 — End: 2024-04-07
  Administered 2024-04-06 – 2024-04-07 (×3): 2 mg via ORAL

## 2024-04-06 MED ORDER — HYDROMORPHONE HCL 1 MG/ML IJ SOLN
1 | INTRAMUSCULAR | Status: AC
Start: 2024-04-06 — End: 2024-04-06
  Administered 2024-04-06: 18:00:00 0.5 via INTRAVENOUS

## 2024-04-06 MED ORDER — SODIUM CHLORIDE 0.9 % IV SOLN
0.9 | INTRAVENOUS | Status: AC
Start: 2024-04-06 — End: 2024-04-06
  Administered 2024-04-06: 16:00:00 475 mg via INTRAVENOUS

## 2024-04-06 MED ORDER — HYDROMORPHONE HCL 1 MG/ML IJ SOLN
1 | INTRAMUSCULAR | Status: DC | PRN
Start: 2024-04-06 — End: 2024-04-06

## 2024-04-06 MED ORDER — CEFAZOLIN 1000 MG-GENTAMICIN 80 MG IN NS 500 ML
Status: DC | PRN
Start: 2024-04-06 — End: 2024-04-06
  Administered 2024-04-06: 17:00:00 500

## 2024-04-06 MED ORDER — HALOPERIDOL LACTATE 5 MG/ML IJ SOLN
5 | Freq: Once | INTRAMUSCULAR | Status: DC | PRN
Start: 2024-04-06 — End: 2024-04-06

## 2024-04-06 MED ORDER — ACETAMINOPHEN 500 MG PO TABS
500 | ORAL | Status: AC
Start: 2024-04-06 — End: 2024-04-06
  Administered 2024-04-06: 15:00:00 1000 via ORAL

## 2024-04-06 MED ORDER — LIDOCAINE HCL (PF) 2 % IJ SOLN
2 | Freq: Once | INTRAMUSCULAR | Status: DC | PRN
Start: 2024-04-06 — End: 2024-04-06
  Administered 2024-04-06: 16:00:00 80 via INTRAVENOUS

## 2024-04-06 MED ORDER — SODIUM CHLORIDE 0.9 % IV SOLN
0.9 | INTRAVENOUS | Status: DC | PRN
Start: 2024-04-06 — End: 2024-04-07

## 2024-04-06 MED ORDER — LORAZEPAM 2 MG/ML IJ SOLN
2 | Freq: Once | INTRAMUSCULAR | Status: AC | PRN
Start: 2024-04-06 — End: 2024-04-06

## 2024-04-06 MED ORDER — LABETALOL HCL 5 MG/ML IV SOLN
5 | INTRAVENOUS | Status: DC | PRN
Start: 2024-04-06 — End: 2024-04-06

## 2024-04-06 MED ORDER — DEXAMETHASONE SODIUM PHOSPHATE 4 MG/ML IJ SOLN
4 | INTRAMUSCULAR | Status: AC
Start: 2024-04-06 — End: ?

## 2024-04-06 MED ORDER — HYDROMORPHONE HCL 2 MG PO TABS
2 | ORAL | Status: DC | PRN
Start: 2024-04-06 — End: 2024-04-07

## 2024-04-06 MED ORDER — NORMAL SALINE FLUSH 0.9 % IV SOLN
0.9 | Freq: Two times a day (BID) | INTRAVENOUS | Status: DC
Start: 2024-04-06 — End: 2024-04-07
  Administered 2024-04-07 (×2): 10 mL via INTRAVENOUS

## 2024-04-06 MED ORDER — NALOXONE HCL 0.4 MG/ML IJ SOLN
0.4 | INTRAMUSCULAR | Status: DC | PRN
Start: 2024-04-06 — End: 2024-04-06

## 2024-04-06 MED ORDER — SUCCINYLCHOLINE CHLORIDE 20 MG/ML IJ SOLN
20 | Freq: Once | INTRAMUSCULAR | Status: DC | PRN
Start: 2024-04-06 — End: 2024-04-06
  Administered 2024-04-06: 16:00:00 160 via INTRAVENOUS

## 2024-04-06 MED ORDER — MIDAZOLAM HCL 2 MG/2ML IJ SOLN
2 | INTRAMUSCULAR | Status: AC
Start: 2024-04-06 — End: ?

## 2024-04-06 MED ORDER — DEXAMETHASONE SODIUM PHOSPHATE 4 MG/ML IJ SOLN
4 | Freq: Once | INTRAMUSCULAR | Status: DC | PRN
Start: 2024-04-06 — End: 2024-04-06
  Administered 2024-04-06: 16:00:00 8 via INTRAVENOUS

## 2024-04-06 MED ORDER — TAMSULOSIN HCL 0.4 MG PO CAPS
0.4 | Freq: Two times a day (BID) | ORAL | Status: DC
Start: 2024-04-06 — End: 2024-04-07
  Administered 2024-04-06 – 2024-04-07 (×3): 0.4 mg via ORAL

## 2024-04-06 MED ORDER — SODIUM CHLORIDE 0.45 % IV SOLN
0.45 | INTRAVENOUS | Status: DC
Start: 2024-04-06 — End: 2024-04-07
  Administered 2024-04-06 – 2024-04-07 (×2): via INTRAVENOUS

## 2024-04-06 MED ORDER — NORMAL SALINE FLUSH 0.9 % IV SOLN
0.9 | Freq: Two times a day (BID) | INTRAVENOUS | Status: DC
Start: 2024-04-06 — End: 2024-04-06

## 2024-04-06 MED ORDER — ACETAMINOPHEN 500 MG PO TABS
500 | Freq: Once | ORAL | Status: AC
Start: 2024-04-06 — End: 2024-04-06

## 2024-04-06 MED ORDER — SUCCINYLCHOLINE CHLORIDE 20 MG/ML IJ SOLN
20 | INTRAMUSCULAR | Status: AC
Start: 2024-04-06 — End: ?

## 2024-04-06 MED ORDER — PROPOFOL 200 MG/20ML IV EMUL
200 | Freq: Once | INTRAVENOUS | Status: DC | PRN
Start: 2024-04-06 — End: 2024-04-06
  Administered 2024-04-06: 16:00:00 200 via INTRAVENOUS

## 2024-04-06 MED ORDER — TADALAFIL 20 MG PO TABS
20 | ORAL | Status: DC | PRN
Start: 2024-04-06 — End: 2024-04-07

## 2024-04-06 MED ORDER — MIDAZOLAM HCL 2 MG/2ML IJ SOLN
2 | Freq: Once | INTRAMUSCULAR | Status: DC | PRN
Start: 2024-04-06 — End: 2024-04-06
  Administered 2024-04-06: 16:00:00 2 via INTRAVENOUS

## 2024-04-06 MED ORDER — MORPHINE SULFATE 2 MG/ML IJ SOLN
2 | INTRAMUSCULAR | Status: DC | PRN
Start: 2024-04-06 — End: 2024-04-07
  Administered 2024-04-07 (×3): 2 mg via INTRAVENOUS

## 2024-04-06 MED ORDER — PROPOFOL 200 MG/20ML IV EMUL
200 | INTRAVENOUS | Status: AC
Start: 2024-04-06 — End: ?

## 2024-04-06 MED ORDER — ALBUTEROL SULFATE HFA 108 (90 BASE) MCG/ACT IN AERS
108 | Freq: Once | RESPIRATORY_TRACT | Status: DC | PRN
Start: 2024-04-06 — End: 2024-04-06
  Administered 2024-04-06: 16:00:00 2 via RESPIRATORY_TRACT

## 2024-04-06 MED ORDER — LACTATED RINGERS IV SOLN
INTRAVENOUS | Status: DC | PRN
Start: 2024-04-06 — End: 2024-04-06
  Administered 2024-04-06 (×2): via INTRAVENOUS

## 2024-04-06 MED ORDER — MAGNESIUM SULFATE 50 % IJ SOLN
50 | Freq: Once | INTRAMUSCULAR | Status: DC | PRN
Start: 2024-04-06 — End: 2024-04-06
  Administered 2024-04-06: 16:00:00 2 via INTRAVENOUS

## 2024-04-06 MED ORDER — ROCURONIUM BROMIDE 50 MG/5ML IV SOLN
50 | INTRAVENOUS | Status: AC
Start: 2024-04-06 — End: ?

## 2024-04-06 MED ORDER — LACTATED RINGERS IV SOLN
INTRAVENOUS | Status: DC
Start: 2024-04-06 — End: 2024-04-06

## 2024-04-06 MED ORDER — HYDROCODONE-ACETAMINOPHEN 10-325 MG PO TABS
10-325 | Freq: Four times a day (QID) | ORAL | Status: DC | PRN
Start: 2024-04-06 — End: 2024-04-07
  Administered 2024-04-06: 20:00:00 1 via ORAL

## 2024-04-06 MED ORDER — FINASTERIDE 5 MG PO TABS
5 | Freq: Every day | ORAL | Status: DC
Start: 2024-04-06 — End: 2024-04-07
  Administered 2024-04-06 – 2024-04-07 (×2): 5 mg via ORAL

## 2024-04-06 MED ORDER — METFORMIN HCL ER 750 MG PO TB24
750 | Freq: Every evening | ORAL | Status: DC
Start: 2024-04-06 — End: 2024-04-07
  Administered 2024-04-07: 01:00:00 750 mg via ORAL

## 2024-04-06 MED ORDER — MAGNESIUM SULFATE 50 % IJ SOLN
50 | INTRAMUSCULAR | Status: AC
Start: 2024-04-06 — End: ?

## 2024-04-06 MED ORDER — DIPHENHYDRAMINE HCL 50 MG/ML IJ SOLN
50 | Freq: Once | INTRAMUSCULAR | Status: DC | PRN
Start: 2024-04-06 — End: 2024-04-06

## 2024-04-06 MED ORDER — IBUPROFEN 200 MG PO TABS
200 | Freq: Three times a day (TID) | ORAL | Status: DC | PRN
Start: 2024-04-06 — End: 2024-04-07
  Administered 2024-04-07: 01:00:00 600 mg via ORAL

## 2024-04-06 MED ORDER — ONDANSETRON HCL 4 MG/2ML IJ SOLN
4 | INTRAMUSCULAR | Status: AC
Start: 2024-04-06 — End: ?

## 2024-04-06 MED FILL — LORAZEPAM 2 MG/ML IJ SOLN: 2 MG/ML | INTRAMUSCULAR | Qty: 1 | Fill #0

## 2024-04-06 MED FILL — ANECTINE 20 MG/ML IJ SOLN: 20 MG/ML | INTRAMUSCULAR | Qty: 10 | Fill #0

## 2024-04-06 MED FILL — ROCURONIUM BROMIDE 50 MG/5ML IV SOLN: 50 MG/5ML | INTRAVENOUS | Qty: 5 | Fill #0

## 2024-04-06 MED FILL — DILAUDID 1 MG/ML IJ SOLN: 1 MG/ML | INTRAMUSCULAR | Qty: 0.5 | Fill #0

## 2024-04-06 MED FILL — HYDROCODONE-ACETAMINOPHEN 10-325 MG PO TABS: 10-325 MG | ORAL | Qty: 1 | Fill #0

## 2024-04-06 MED FILL — DIPRIVAN 200 MG/20ML IV EMUL: 200 MG/20ML | INTRAVENOUS | Qty: 20 | Fill #0

## 2024-04-06 MED FILL — DEXAMETHASONE SODIUM PHOSPHATE 4 MG/ML IJ SOLN: 4 MG/ML | INTRAMUSCULAR | Qty: 2 | Fill #0

## 2024-04-06 MED FILL — HYDROMORPHONE HCL 2 MG/ML IJ SOLN: 2 MG/ML | INTRAMUSCULAR | Qty: 1 | Fill #0

## 2024-04-06 MED FILL — XYLOCAINE-MPF 1 % IJ SOLN: 1 % | INTRAMUSCULAR | Qty: 2 | Fill #0

## 2024-04-06 MED FILL — HYDROMORPHONE HCL 2 MG PO TABS: 2 MG | ORAL | Qty: 1 | Fill #0

## 2024-04-06 MED FILL — PHENYLEPHRINE HCL (PRESSORS) 1 MG/10ML IV SOSY: 1 MG/0ML | INTRAVENOUS | Qty: 10 | Fill #0

## 2024-04-06 MED FILL — ONDANSETRON HCL 4 MG/2ML IJ SOLN: 4 MG/2ML | INTRAMUSCULAR | Qty: 2 | Fill #0

## 2024-04-06 MED FILL — MAGNESIUM SULFATE 50 % IJ SOLN: 50 % | INTRAMUSCULAR | Qty: 2 | Fill #0

## 2024-04-06 MED FILL — FINASTERIDE 5 MG PO TABS: 5 MG | ORAL | Qty: 1 | Fill #0

## 2024-04-06 MED FILL — XYLOCAINE-MPF 2 % IJ SOLN: 2 % | INTRAMUSCULAR | Qty: 5 | Fill #0

## 2024-04-06 MED FILL — EPHEDRINE SULFATE (PRESSORS) 50 MG/ML IV SOLN: 50 MG/ML | INTRAVENOUS | Qty: 1 | Fill #0

## 2024-04-06 MED FILL — TAMSULOSIN HCL 0.4 MG PO CAPS: 0.4 MG | ORAL | Qty: 1 | Fill #0

## 2024-04-06 MED FILL — FENTANYL CITRATE (PF) 100 MCG/2ML IJ SOLN: 100 MCG/2ML | INTRAMUSCULAR | Qty: 2 | Fill #0

## 2024-04-06 MED FILL — KETOROLAC TROMETHAMINE 30 MG/ML IJ SOLN: 30 MG/ML | INTRAMUSCULAR | Qty: 1 | Fill #0

## 2024-04-06 MED FILL — ACETAMINOPHEN EXTRA STRENGTH 500 MG PO TABS: 500 MG | ORAL | Qty: 2 | Fill #0

## 2024-04-06 MED FILL — MIDAZOLAM HCL 2 MG/2ML IJ SOLN: 2 MG/ML | INTRAMUSCULAR | Qty: 2 | Fill #0

## 2024-04-06 MED FILL — GENTAMICIN SULFATE 40 MG/ML IJ SOLN: 40 MG/ML | INTRAMUSCULAR | Qty: 11.88 | Fill #0

## 2024-04-06 MED FILL — CEFTRIAXONE SODIUM 2 G IJ SOLR: 2 g | INTRAMUSCULAR | Qty: 2000 | Fill #0

## 2024-04-06 NOTE — Op Note (Signed)
 Operative Note      Patient: Garrett Manning  Date of Birth: 02-18-1966  MRN: [id number]    Date of Procedure: 04/25/24    Pre-Op Diagnosis Codes:      * Erectile dysfunction [N52.9]    Post-Op Diagnosis: Same       Procedure(s):  PENIS PROSTHESIS INSERTION    Surgeon(s):  Rachel Budds, Case, MD  Jenevie Casstevens, Aldean Amass, MD    Assistant:     Anesthesia: General    Estimated Blood Loss (mL): Minimal    Complications: None    Specimens:   * No specimens in log *    Implants:  Implant Name Type Inv. Item Serial No. Manufacturer Lot No. LRB No. Used Action   PROSTHESIS PENILE INFLATABLE 0 DEG 18 CM INFPUB TITAN TCH LF - SNA  PROSTHESIS PENILE INFLATABLE 0 DEG 18 CM INFPUB TITAN TCH LF NA [employer] [id number] N/A 1 Implanted   RESERVOIR PENILE PROS 75CC CLVRLF W/ LOK OUT VLV TI - SNA  RESERVOIR PENILE PROS 75CC CLVRLF W/ LOK OUT VLV TI NA [employer] [id number] Right 1 Implanted         Drains:   Closed/Suction Drain Right RLQ Bulb (Active)       Urinary Catheter 2024-04-25 2 Way (Active)       Findings:  Infection Present At Time Of Surgery (PATOS) (choose all levels that have infection present):  No infection present  Other Findings: 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  A 2 to 3 inch incision was then made 2 fingerbreadths above the penis and carried down through the skin and subcutaneous areas until the corpora were encountered they were then skeletal lysed laterally a Foley catheter had been placed prior to incision to better outline the urethra on the most proximal portion of the corpora 2 2-0 PDS sutures placed horizontally to allow for  corporotomy between them the corporotomy then open the tissue there was not a lot of bleeding on either side and there was moderate fibrosis making finding the lumen somewhat difficult I was able to use a Metzenbaum to track both proximally and distally encountering multiple fibrotic areas requiring dilatation but I was able to get the full length area dilated on the right and left proximally and due to his prior shunt procedure we were careful not to proceed with dilation much further than the corona of the glans we then put in the measuring device which also dilated some and measured 1 19 as a corporal length we decided on 18 prosthesis with 1 rear-tip extender while they were preparing this we addressed the right inguinal area I was able to locate the external ring and dissected superiorly and somewhat medial to it using a nasal speculum I was able to puncture into the space of Retzius and then turned the nasal speculum toward the head and opened up the space for the reservoir which we placed and filled checking for any back pressure 75 cc was in the reservoir we then clamped it I then addressed the scrotum and soft dissected down the right lateral scrotum to the most inferior portion  placed a [employer]'s to prepare it for the pump we then used the Furlow device to insert the cylinders in each corpora they actually went in fairly easily considering the amount of dissection and dilation required to prepare and once they were in we inflated the cylinders and found a excellent erection we then carefully closed the corporotomies with these sutures placed prior to the incision on the left there was a slight gap which I closed carefully using a 2-0 Vicryl once this was accomplished we again did an erection which appeared to function well we placed the pump in the scrotum in the dependent portion did not feel like it needed any fixation we then used the quick connectors to connect the pump to the reservoir and cylinders  there was minimal bleeding although there was a little bleeding from the corpora once they were opened and vacuumed drain was placed in the scrotum brought out past the corporotomies and then out the right inguinal area antibiotic solution was used to irrigate the corpora and the wound during the case the wound was then reapproximated using 2-0 Vicryl subcutaneous sutures and skin staples were used to reapproximate the skin minimally compressive dressing was applied and a dressing to the skin the patient was then awakened and taken recovery in stable condition    Electronically signed by Chiquita Councilman, MD on 04/06/2024 at 1:41 PM

## 2024-04-06 NOTE — H&P (Signed)
 Urology Attending Admission Note      Reason for Admission: Erectile dysfunction after failure of all other treatments and priapism requiring shunt after injection therapy    History: Increasing difficulty with erectile dysfunction failure of oral medications unfortunately after injection therapy treatment he developed significant priapism requiring a corporal spongiosum shunt he has not had erections since now admitted for placement of inflatable penile prosthesis have discussed indications alternatives possible complications he understands and agrees particularly we have discussed the difficulty after priapism the problems associated with infection or pain    Meds: see med rec  Family History, Social History, Review of Systems:  Reviewed and agreed to as per chart    Exam:    Vitals:  BP (!) 138/97   Pulse 91   Temp 98.2 F (36.8 C) (Oral)   Resp 18   Ht 1.829 m (6')   Wt 108.9 kg (240 lb)   SpO2 96%   BMI 32.55 kg/m   Temp  Avg: 98.2 F (36.8 C)  Min: 98.2 F (36.8 C)  Max: 98.2 F (36.8 C)  No intake or output data in the 24 hours ending 04/06/24 1149    Physical:   Well developed, well nourished in no acute distress  Mood indicates no abnormalities. Pt doesn't appear depressed  Orientated to time and place  Neck is supple, trachea is midline  Respiratory effort is normal  Cardiovascular show no extremity swelling  Abdomen no masses or hernias are palpated, there is no tenderness. Liver and Spleen appear normal.  Skin show no abnormal lesions  Lymph nodes are not palpated in the inguinal, neck, or axillary area.     Male GU:  Penis appears normal and circumcised  Urethral meatus is normal in size and location  Scrotum appears normal and both testicles appear normal in size and location  Sphincter has good tone  Anus is inspected. There are no perineal masses  Prostate is 30 g without nodule, no tenderness and no induration  Seminal Vesicles are not palpable      Labs:  WBC:    Lab Results   Component  Value Date/Time    WBC 12.1 01/18/2024 10:27 AM     Hemoglobin/Hematocrit:    Lab Results   Component Value Date/Time    HGB 13.1 01/18/2024 10:27 AM    HCT 40.1 01/18/2024 10:27 AM     BMP:    Lab Results   Component Value Date/Time    NA 137 07/19/2023 06:43 AM    K 3.9 07/19/2023 06:43 AM    CL 102 07/19/2023 06:43 AM    CO2 27 07/19/2023 06:43 AM    BUN 13 07/19/2023 06:43 AM    CREATININE 0.9 07/19/2023 06:43 AM    CALCIUM 8.5 07/19/2023 06:43 AM    LABGLOM 99 07/19/2023 06:43 AM    LABGLOM 103 05/19/2022 06:42 AM     PT/INR:  No results found for: "PROTIME", "INR"  PTT:  No results found for: "APTT"[APTT    Urinalysis: Negative    Imaging: None recently    Impression/Plan: Erectile dysfunction/impotence UroNav injection therapy with priapism requiring corporal spongiosum shunt now for placement of inflatable penile prosthesis    Chiquita Councilman, MD

## 2024-04-06 NOTE — Plan of Care (Signed)
 Problem: Pain  Goal: Verbalizes/displays adequate comfort level or baseline comfort level  04/06/2024 2047 by Waldon Gula, RN  Outcome: Progressing  04/06/2024 1854 by Victorio Grave, RN  Outcome: Progressing     Problem: Chronic Conditions and Co-morbidities  Goal: Patient's chronic conditions and co-morbidity symptoms are monitored and maintained or improved  04/06/2024 2047 by Waldon Gula, RN  Outcome: Progressing  04/06/2024 1854 by Victorio Grave, RN  Outcome: Progressing  Flowsheets (Taken 04/06/2024 1803)  Care Plan - Patient's Chronic Conditions and Co-Morbidity Symptoms are Monitored and Maintained or Improved: Monitor and assess patient's chronic conditions and comorbid symptoms for stability, deterioration, or improvement     Problem: Discharge Planning  Goal: Discharge to home or other facility with appropriate resources  04/06/2024 2047 by Waldon Gula, RN  Outcome: Progressing  04/06/2024 1854 by Victorio Grave, RN  Outcome: Progressing  Flowsheets (Taken 04/06/2024 1803)  Discharge to home or other facility with appropriate resources: Identify barriers to discharge with patient and caregiver     Problem: ABCDS Injury Assessment  Goal: Absence of physical injury  04/06/2024 2047 by Waldon Gula, RN  Outcome: Progressing  04/06/2024 1854 by Victorio Grave, RN  Outcome: Progressing

## 2024-04-06 NOTE — Anesthesia Procedure Notes (Signed)
 Airway  Date/Time: 04/06/2024 12:12 PM  Urgency: elective      General Information and Staff    Patient location during procedure: OR  Anesthesiologist: Juanell Nora, MD  Resident/CRNA: Rometta Coad, APRN - CRNA  Performed by: Rometta Coad, APRN - CRNA  Authorized by: Juanell Nora, MD      Indications and Patient Condition  Indications for airway management: anesthesia and airway protection  Spontaneous Ventilation: absent  Sedation level: deep  Preoxygenated: yes  Patient position: sniffing  Mask difficulty assessment: not attempted    Final Airway Details  Final airway type: endotracheal airway      Successful airway: ETT     Successful intubation technique: direct laryngoscopy  Blade: Macintosh  Blade size: #4  ETT size (mm): 7.5  Cormack-Lehane Classification: grade I - full view of glottis  Placement verified by: chest auscultation and capnometry   Measured from: teeth  ETT to teeth (cm): 23  Number of attempts at approach: 1    Additional Comments  Atraumatic intubation, airway exam unchanged from preop exam. Lips, teeth, and gums unchanged from preop exam.     + ETCO2, bilateral breath sounds, VS monitored immediately post-induction and intubation.     RSI approach, suspected full stomach. PT did not follow NPO guidelines

## 2024-04-06 NOTE — Anesthesia Post-Procedure Evaluation (Signed)
 Department of Anesthesiology  Postprocedure Note    Patient: Garrett Manning  MRN: [id number]  Birthdate: December 15, 1965  Date of evaluation: 04/06/2024    Procedure Summary       Date: 04/06/24 Room / Location: [facility] OR 08 / [facility] MAIN OR    Anesthesia Start: 1208 Anesthesia Stop: 1349    Procedure: PENIS PROSTHESIS INSERTION Diagnosis:       Erectile dysfunction      (Erectile dysfunction [N52.9])    Surgeons: Chiquita Councilman, MD Responsible Provider: Juanell Nora, MD    Anesthesia Type: General ASA Status: 3            Anesthesia Type: General    Aldrete Phase I: Aldrete Score: 8    Aldrete Phase II:      Anesthesia Post Evaluation    Patient location during evaluation: PACU  Patient participation: complete - patient participated  Level of consciousness: awake and alert  Pain score: 5  Airway patency: patent  Nausea & Vomiting: no nausea and no vomiting  Cardiovascular status: hemodynamically stable  Respiratory status: spontaneous ventilation, room air and acceptable  Comments: VSS  Multimodal analgesia pain management approach  Pain management: satisfactory to patient    No notable events documented.

## 2024-04-06 NOTE — Plan of Care (Signed)
 Problem: Pain  Goal: Verbalizes/displays adequate comfort level or baseline comfort level  Outcome: Progressing     Problem: Chronic Conditions and Co-morbidities  Goal: Patient's chronic conditions and co-morbidity symptoms are monitored and maintained or improved  Outcome: Progressing  Flowsheets (Taken 04/06/2024 1803)  Care Plan - Patient's Chronic Conditions and Co-Morbidity Symptoms are Monitored and Maintained or Improved: Monitor and assess patient's chronic conditions and comorbid symptoms for stability, deterioration, or improvement     Problem: Discharge Planning  Goal: Discharge to home or other facility with appropriate resources  Outcome: Progressing  Flowsheets (Taken 04/06/2024 1803)  Discharge to home or other facility with appropriate resources: Identify barriers to discharge with patient and caregiver     Problem: ABCDS Injury Assessment  Goal: Absence of physical injury  Outcome: Progressing

## 2024-04-06 NOTE — Anesthesia Pre-Procedure Evaluation (Signed)
 Department of Anesthesiology  Preprocedure Note       Name:  Markell Avram   Age:  58 y.o.  DOB:  1966-08-28                                          MRN:  [id number]         Date:  04/06/2024      Surgeon: Kelby Patches):  Ramsay, Aldean Amass, MD  Rachel Budds, Case, MD    Procedure: Procedure(s):  PENIS PROSTHESIS INSERTION    Medications prior to admission:   Prior to Admission medications    Medication Sig Start Date End Date Taking? Authorizing Provider   OZEMPIC, 2 MG/DOSE, 8 MG/3ML SOPN sc injection Inject 2 mg into the skin every 7 days 12/14/23  Yes [provider]   tadalafil (CIALIS) 20 MG tablet Take 1 tablet by mouth as needed for Erectile Dysfunction 01/02/24  Yes [provider]   HYDROcodone -acetaminophen  (NORCO ) 10-325 MG per tablet Take 1 tablet by mouth every 6 hours as needed for Pain.  Patient not taking: Reported on 03/20/2024    [provider]   metFORMIN  (GLUCOPHAGE -XR) 750 MG extended release tablet Take 1 tablet by mouth at bedtime    [provider]   tamsulosin  (FLOMAX ) 0.4 MG capsule Take 1 capsule by mouth in the morning and at bedtime    [provider]   finasteride  (PROSCAR ) 5 MG tablet Take 1 tablet by mouth daily    [provider]       Current medications:    Current Facility-Administered Medications   Medication Dose Route Frequency Provider Last Rate Last Admin   . lidocaine  PF 1 % injection 0.1 mL  0.1 mL IntraDERmal Once Ramsay, Aldean Amass, MD       . sodium chloride  flush 0.9 % injection 5-40 mL  5-40 mL IntraVENous 2 times per day Ramsay, Aldean Amass, MD       . sodium chloride  flush 0.9 % injection 5-40 mL  5-40 mL IntraVENous PRN Ramsay, Aldean Amass, MD       . 0.9 % sodium chloride  infusion   IntraVENous PRN Ramsay, Aldean Amass, MD       . gentamicin (GARAMYCIN) 475 mg in sodium chloride  0.9 % 100 mL IVPB  475 mg IntraVENous On Call to OR Ramsay, Aldean Amass, MD       . cefTRIAXone (ROCEPHIN) 2,000  mg in sodium chloride  0.9 % 100 mL IVPB (mini-bag)  2,000 mg IntraVENous On Call to OR Ramsay, Aldean Amass, MD       . lactated ringers  infusion   IntraVENous Continuous Ramsay, Aldean Amass, MD           Allergies:  No Known Allergies    Problem List:    Patient Active Problem List   Diagnosis Code   . S/P transurethral resection of prostate Z90.79   . Priapism N48.30   . 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 S/P TURP (status post transurethral resection of prostate) Z90.79   . Erectile dysfunction N52.9       Past Medical History:        Diagnosis Date   . Back pain    . Diabetes (HCC)    . Enlarged prostate    . Enlarged prostate with urinary obstruction    . Erectile dysfunction    .  Wears glasses        Past Surgical History:        Procedure Laterality Date   . FOOT SURGERY Bilateral    . KNEE ARTHROSCOPY Right    . LUMBAR SPINE SURGERY     . PENIS SURGERY N/A 07/19/2023    PENIS FRACTURE REPAIR performed by Ramsay, Aldean Amass, MD at [facility] MAIN OR   . TONSILLECTOMY     . TURP N/A 05/17/2022    CYSTOSCOPY TRANSURETHRAL RESECTION PROSTATE performed by Chiquita Councilman, MD at [facility] MAIN OR   . TURP N/A 01/16/2024    CYSTOSCOPY TRANSURETHRAL RESECTION PROSTATE performed by Ramsay, Aldean Amass, MD at [facility] MAIN OR       Social History:    Social History     Tobacco Use   . Smoking status: Every Day     Current packs/day: 1.00     Average packs/day: 1 pack/day for 48.3 years (48.3 ttl pk-yrs)     Types: Cigarettes     Start date: 75   . Smokeless tobacco: Not on file   Substance Use Topics   . Alcohol use: Never                                Ready to quit: Not Answered  Counseling given: Not Answered      Vital Signs (Current):   Vitals:    03/20/24 1528 04/06/24 1042   BP:  (!) 138/97   Pulse:  91   Resp:  18   Temp:  98.2 F (36.8 C)   TempSrc:  Oral   SpO2:  96%   Weight: 117 kg (258 lb) 108.9 kg (240 lb)   Height: 1.854 m (6' 0.99") 1.829 m (6')                                               BP Readings from Last 3 Encounters:   04/06/24 (!) 138/97   01/19/24 133/80   07/20/23 132/77       NPO Status: Time of last liquid consumption: 0530 (gatorade)                        Time of last solid consumption: 2100 (anesthesia aware)                        Date of last liquid consumption: 04/06/24                        Date of last solid food consumption: 04/05/24    BMI:   Wt Readings from Last 3 Encounters:   04/06/24 108.9 kg (240 lb)   04/06/24 117 kg (258 lb)   01/19/24 117.3 kg (258 lb 9.6 oz)     Body mass index is 32.55 kg/m.    CBC:   Lab Results   Component Value Date/Time    WBC 12.1 01/18/2024 10:27 AM    RBC 4.34 01/18/2024 10:27 AM    HGB 13.1 01/18/2024 10:27 AM    HCT 40.1 01/18/2024 10:27 AM    MCV 92.4 01/18/2024 10:27 AM    RDW 15.2 01/18/2024 10:27 AM    PLT 238 01/18/2024 10:27 AM  CMP:   Lab Results   Component Value Date/Time    NA 137 07/19/2023 06:43 AM    K 3.9 07/19/2023 06:43 AM    CL 102 07/19/2023 06:43 AM    CO2 27 07/19/2023 06:43 AM    BUN 13 07/19/2023 06:43 AM    CREATININE 0.9 07/19/2023 06:43 AM    LABGLOM 99 07/19/2023 06:43 AM    LABGLOM 103 05/19/2022 06:42 AM    GLUCOSE 105 07/19/2023 06:43 AM    CALCIUM 8.5 07/19/2023 06:43 AM       POC Tests: No results for input(s): "POCGLU", "POCNA", "POCK", "POCCL", "POCBUN", "POCHEMO", "POCHCT" in the last 72 hours.    Coags: No results found for: "PROTIME", "INR", "APTT"    HCG (If Applicable): No results found for: "PREGTESTUR", "PREGSERUM", "HCG", "HCGQUANT"     ABGs: No results found for: "PHART", "PO2ART", "PCO2ART", "HCO3ART", "BEART", "O2SATART"     Type & Screen (If Applicable):  No results found for: "ABORH", "LABANTI"    Drug/Infectious Status (If Applicable):  No results found for: "HIV", "HEPCAB"    COVID-19 Screening (If Applicable): No results found for: "COVID19"        Anesthesia Evaluation  Patient summary reviewed  Airway: Mallampati: II  TM distance: >3 FB   Neck ROM:  full  Mouth opening: > = 3 FB   Dental:    (+) edentulous      Pulmonary: breath sounds clear to auscultation  (+)           current smoker          Patient did not smoke on day of surgery.                 Cardiovascular:  Exercise tolerance: good (>4 METS)          Rhythm: regular  Rate: normal                    Neuro/Psych:   Negative Neuro/Psych ROS              GI/Hepatic/Renal: Neg GI/Hepatic/Renal ROS            Endo/Other:    (+) DiabetesType II DM, no interval change.                 Abdominal:   (+) obese          Vascular: negative vascular ROS.         Other Findings:       Anesthesia Plan      general     ASA 3       Induction: intravenous.    MIPS: Postoperative opioids intended and Prophylactic antiemetics administered.  Anesthetic plan and risks discussed with patient.      Plan discussed with CRNA.    Attending anesthesiologist reviewed and agrees with Preprocedure content            Juanell Nora, MD   04/06/2024

## 2024-04-07 MED ORDER — CEPHALEXIN 500 MG PO CAPS
500 | ORAL_CAPSULE | Freq: Three times a day (TID) | ORAL | 0 refills | 7.00000 days | Status: AC
Start: 2024-04-07 — End: 2024-04-14

## 2024-04-07 MED ORDER — HYDROCODONE-ACETAMINOPHEN 10-325 MG PO TABS
10-325 | ORAL_TABLET | Freq: Four times a day (QID) | ORAL | 0 refills | Status: AC | PRN
Start: 2024-04-07 — End: 2024-04-14

## 2024-04-07 MED FILL — MORPHINE SULFATE 2 MG/ML IJ SOLN: 2 mg/mL | INTRAMUSCULAR | Qty: 1 | Fill #0

## 2024-04-07 MED FILL — METFORMIN HCL ER 750 MG PO TB24: 750 MG | ORAL | Qty: 1 | Fill #0

## 2024-04-07 MED FILL — NORMAL SALINE FLUSH 0.9 % IV SOLN: 0.9 % | INTRAVENOUS | Qty: 10 | Fill #0

## 2024-04-07 MED FILL — TAMSULOSIN HCL 0.4 MG PO CAPS: 0.4 MG | ORAL | Qty: 1 | Fill #0

## 2024-04-07 MED FILL — HYDROMORPHONE HCL 2 MG PO TABS: 2 MG | ORAL | Qty: 1 | Fill #0

## 2024-04-07 MED FILL — FINASTERIDE 5 MG PO TABS: 5 MG | ORAL | Qty: 1 | Fill #0

## 2024-04-07 MED FILL — IBUPROFEN 200 MG PO TABS: 200 MG | ORAL | Qty: 3 | Fill #0

## 2024-04-07 NOTE — Discharge Summary (Signed)
 Urology Discharge Summary      Patient Identification  Garrett Manning is a 58 y.o. male.  DOB:  Feb 01, 1966  Admit Date:  04/06/2024    Discharge date:   No discharge date for patient encounter.                                   Disposition: home    Discharge Diagnoses: Erectile dysfunction  Patient Active Problem List   Diagnosis    S/P transurethral resection of prostate    Priapism    Enlarged prostate with urinary obstruction    S/P TURP (status post transurethral resection of prostate)    Erectile dysfunction       Surgery: Placement of inflatable penile prosthesis Coloplast    Activity:  activity as tolerated and no driving for today    Condition on discharge: Good    Follow-up: 1 week    Hospital course: Impotence after episode of priapism and diabetic with increasing erectile dysfunction had 3 piece inflatable penile prosthesis placed with significant difficulty did well overnight with moderate JP drainage moderate discomfort looks good drain removed catheter removed follow-up in 1 week    Chiquita Councilman, MD

## 2024-04-07 NOTE — Care Coordination-Inpatient (Signed)
 04/07/2024 [facility] - POD # 1 PENIS PROSTHESIS INSERTION with Dr.Ramsay.MD did early discharge,by the time CM get to pt,pt has already left the hospital.Chart review completed and discussed with CN Mona,no home care needs identified/ordered per chart review.Pt to follow up with Dr.Ramsay in 1 week in the office.
